# Patient Record
Sex: Female | Born: 1970 | Race: Black or African American | Hispanic: No | Marital: Single | Smoking: Never smoker
Health system: Southern US, Community
[De-identification: ages and names within clinical notes are randomized; demographics above are authoritative.]

## PROBLEM LIST (undated history)

## (undated) DIAGNOSIS — K219 Gastro-esophageal reflux disease without esophagitis: Secondary | ICD-10-CM

## (undated) DIAGNOSIS — I739 Peripheral vascular disease, unspecified: Secondary | ICD-10-CM

## (undated) DIAGNOSIS — I499 Cardiac arrhythmia, unspecified: Secondary | ICD-10-CM

## (undated) DIAGNOSIS — M869 Osteomyelitis, unspecified: Secondary | ICD-10-CM

## (undated) DIAGNOSIS — I82409 Acute embolism and thrombosis of unspecified deep veins of unspecified lower extremity: Secondary | ICD-10-CM

## (undated) DIAGNOSIS — I2510 Atherosclerotic heart disease of native coronary artery without angina pectoris: Secondary | ICD-10-CM

## (undated) DIAGNOSIS — Z992 Dependence on renal dialysis: Secondary | ICD-10-CM

## (undated) DIAGNOSIS — E119 Type 2 diabetes mellitus without complications: Secondary | ICD-10-CM

## (undated) DIAGNOSIS — I509 Heart failure, unspecified: Secondary | ICD-10-CM

## (undated) DIAGNOSIS — I10 Essential (primary) hypertension: Secondary | ICD-10-CM

## (undated) DIAGNOSIS — N189 Chronic kidney disease, unspecified: Secondary | ICD-10-CM

## (undated) DIAGNOSIS — E785 Hyperlipidemia, unspecified: Secondary | ICD-10-CM

## (undated) DIAGNOSIS — J45909 Unspecified asthma, uncomplicated: Secondary | ICD-10-CM

## (undated) DIAGNOSIS — Z972 Presence of dental prosthetic device (complete) (partial): Secondary | ICD-10-CM

## (undated) DIAGNOSIS — M109 Gout, unspecified: Secondary | ICD-10-CM

## (undated) HISTORY — PX: OTHER SURGICAL HISTORY: SHX169

## (undated) HISTORY — PX: ANKLE SURGERY: SHX546

## (undated) HISTORY — PX: TOE AMPUTATION: SHX809

---

## 2007-03-18 HISTORY — PX: ENDOMETRIAL ABLATION: SHX621

## 2008-11-28 ENCOUNTER — Ambulatory Visit: Payer: Self-pay | Admitting: Ophthalmology

## 2008-11-29 ENCOUNTER — Ambulatory Visit: Payer: Self-pay | Admitting: Ophthalmology

## 2011-03-18 HISTORY — PX: OTHER SURGICAL HISTORY: SHX169

## 2013-09-13 HISTORY — PX: TRANSMETATARSAL AMPUTATION: SHX6197

## 2014-01-03 ENCOUNTER — Ambulatory Visit: Payer: Self-pay | Admitting: Ophthalmology

## 2014-01-19 ENCOUNTER — Ambulatory Visit: Payer: Self-pay | Admitting: Ophthalmology

## 2014-07-08 NOTE — Op Note (Signed)
PATIENT NAME:  Sarah MuskratRICHMOND, Sarah MR#:  [id number] DATE OF BIRTH:  1970-12-17  DATE OF PROCEDURE:  01/19/2014  PREOPERATIVE DIAGNOSIS: Nuclear sclerotic cataract in the left eye.  POSTOPERATIVE DIAGNOSIS: Nuclear sclerotic cataract in the left eye. (H25.12)  PROCEDURE PERFORMED: Phacoemulsification with posterior chamber intraocular lens in the left eye.   SURGEON: Lia HoppingNisha Talah Cookston, MD  INDICATION: This is a 44 year old female with decreased vision in the left eye. The procedure, risks and benefits of cataract surgery were discussed at length with the patient including bleeding, infection, retinal detachment, re-operation, diplopia, ptosis, loss of vision and loss of the eye. Informed consent was obtained on the day of surgery. Several sets of preoperative medication were administered to the operative eye including 0.5% tetracaine, 1% cyclopentolate, 10% phenylephrine, 0.5% ketorolac, 0.5% gitaloxin, and 2% lidocaine.  DESCRIPTION OF PROCEDURE: The patient was taken to the operating room and sedated by IV sedation. Topical tetracaine was placed on the eye.  The operative eye was prepped using a 10% betadine solution and covered in sterile drapes leaving only the operative eye exposed. A Lieberman lid speculum was placed to provide exposure. Using 0.12 forceps and a side-port blade, a paracentesis was created. Then a mixture of BSS preservative-free lidocaine and epinephrine was injected into the anterior chamber. Next, Vision Blue was injected into the anterior chamber and washed out with balanced salt solution. Next, a 2.4 mm keratome blade was used to create a two-step full-thickness clear corneal incision temporally. At this point, the pupil was found to be adhered to the anterior lens capsule and a cyclodialysis spatula was used to clear adhesions between the iris and the anterior lens capsule. A Malyugin ring was then inserted into the anterior chamber, again to hold the iris back. A cystitome and  Utrata forceps were then used to create a continuous capsulorrhexis in the anterior lens capsule. BSS on a hydrodissection cannula was used to perform gentle hydrodissection. Phacoemulsification was then performed to remove the nucleus. Irrigation/aspiration was performed to remove the remaining cortical material. At this point, a rent in the posterior capsule was noted. Viscoat was used to fill the anterior chamber and hold the vitreous. Triesence was instilled into the anterior chamber to visualize any vitreous; however, no vitreous was visualized. The pupil was found to be round without any peaking. A MA60AC 17.5-diopter lens was inserted into the ciliary sulcus. The Sinskey hook was to rotate it into proper position in the ciliary sulcus. Aeration/aspiration was performed to remove the remaining Viscoat elastic material from the eye. BSS on a 30-gauge canula was used to hydrate the wound. An intracameral antibiotic was administered. The wounds were checked and found to be watertight. The lid speculum and drapes were carefully removed. Several drops of Vigamox were placed in the operative eye and the eye was covered with protective eyewear. The patient was taken to the recovery area in good condition. In the postanesthesia recovery unit, the patient was given 500 mg of Diamox by mouth.    ____________________________ Lia HoppingNisha Achilles Neville, MD nm:sb D: 01/19/2014 11:42:27 ET T: 01/19/2014 13:30:41 ET JOB#: [id number]  cc: Lia HoppingNisha Jubilee Vivero, MD, <Dictator> Lia HoppingNISHA Latavion Halls MD ELECTRONICALLY SIGNED 01/26/2014 13:45

## 2014-09-14 ENCOUNTER — Encounter
Admission: RE | Admit: 2014-09-14 | Discharge: 2014-09-14 | Disposition: A | Discharge status: Home / Self Care | Payer: Medicare PPO | Source: Ambulatory Visit | Attending: Vascular Surgery | Admitting: Vascular Surgery

## 2014-09-14 DIAGNOSIS — Z01812 Encounter for preprocedural laboratory examination: Secondary | ICD-10-CM | POA: Diagnosis present

## 2014-09-14 DIAGNOSIS — Z01810 Encounter for preprocedural cardiovascular examination: Secondary | ICD-10-CM | POA: Insufficient documentation

## 2014-09-14 HISTORY — DX: Gout, unspecified: M10.9

## 2014-09-14 HISTORY — DX: Unspecified asthma, uncomplicated: J45.909

## 2014-09-14 HISTORY — DX: Essential (primary) hypertension: I10

## 2014-09-14 HISTORY — DX: Heart failure, unspecified: I50.9

## 2014-09-14 HISTORY — DX: Cardiac arrhythmia, unspecified: I49.9

## 2014-09-14 HISTORY — DX: Atherosclerotic heart disease of native coronary artery without angina pectoris: I25.10

## 2014-09-14 HISTORY — DX: Chronic kidney disease, unspecified: N18.9

## 2014-09-14 HISTORY — DX: Peripheral vascular disease, unspecified: I73.9

## 2014-09-14 HISTORY — DX: Hyperlipidemia, unspecified: E78.5

## 2014-09-14 HISTORY — DX: Osteomyelitis, unspecified: M86.9

## 2014-09-14 HISTORY — DX: Acute embolism and thrombosis of unspecified deep veins of unspecified lower extremity: I82.409

## 2014-09-14 LAB — PROTIME-INR
INR: 1.02
PROTHROMBIN TIME: 13.6 s (ref 11.4–15.0)

## 2014-09-14 LAB — CBC
HEMATOCRIT: 29.1 % — AB (ref 35.0–47.0)
Hemoglobin: 9 g/dL — ABNORMAL LOW (ref 12.0–16.0)
MCH: 28.9 pg (ref 26.0–34.0)
MCHC: 30.9 g/dL — ABNORMAL LOW (ref 32.0–36.0)
MCV: 93.7 fL (ref 80.0–100.0)
PLATELETS: 471 10*3/uL — AB (ref 150–440)
RBC: 3.11 MIL/uL — ABNORMAL LOW (ref 3.80–5.20)
RDW: 18.9 % — ABNORMAL HIGH (ref 11.5–14.5)
WBC: 11 10*3/uL (ref 3.6–11.0)

## 2014-09-14 LAB — BASIC METABOLIC PANEL
Anion gap: 18 — ABNORMAL HIGH (ref 5–15)
BUN: 58 mg/dL — AB (ref 6–20)
CALCIUM: 8.9 mg/dL (ref 8.9–10.3)
CHLORIDE: 97 mmol/L — AB (ref 101–111)
CO2: 26 mmol/L (ref 22–32)
Creatinine, Ser: 8.02 mg/dL — ABNORMAL HIGH (ref 0.44–1.00)
GFR calc Af Amer: 6 mL/min — ABNORMAL LOW (ref >60)
GFR calc non Af Amer: 5 mL/min — ABNORMAL LOW (ref >60)
Glucose, Bld: 110 mg/dL — ABNORMAL HIGH (ref 65–99)
Potassium: 4.9 mmol/L (ref 3.5–5.1)
SODIUM: 141 mmol/L (ref 135–145)

## 2014-09-14 LAB — TYPE AND SCREEN
ABO/RH(D): B POS
Antibody Screen: NEGATIVE

## 2014-09-14 LAB — APTT: APTT: 37 s — AB (ref 24–36)

## 2014-09-14 NOTE — Patient Instructions (Signed)
  Your procedure is scheduled on: 09/27/14 Wed Report to Day Surgery. To find out your arrival time please call [phone] between 1PM - 3PM on 09/26/14 Tues.  Remember: Instructions that are not followed completely may result in serious medical risk, up to and including death, or upon the discretion of your surgeon and anesthesiologist your surgery may need to be rescheduled.    __x__ 1. Do not eat food or drink liquids after midnight. No gum chewing or hard candies.     ____ 2. No Alcohol for 24 hours before or after surgery.   ____ 3. Bring all medications with you on the day of surgery if instructed.    __x__ 4. Notify your doctor if there is any change in your medical condition     (cold, fever, infections).     Do not wear jewelry, make-up, hairpins, clips or nail polish.  Do not wear lotions, powders, or perfumes. You may wear deodorant.  Do not shave 48 hours prior to surgery. Men may shave face and neck.  Do not bring valuables to the hospital.    [facility] not responsible for any belongings or valuables.               Contacts, dentures or bridgework may not be worn into surgery.  Leave your suitcase in the car. After surgery it may be brought to your room.  For patients admitted to the hospital, discharge time is determined by your                treatment team.   Patients discharged the day of surgery will not be allowed to drive home.   Please read over the following fact sheets that you were given:      _x___ Take these medicines the morning of surgery with A SIP OF WATER:    1. albuterol (PROVENTIL HFA;VENTOLIN HFA) 108 (90 BASE) MCG/ACT inhaler  2. gabapentin (NEURONTIN) 100 MG capsule  3.   4.  5.  6.  ____ Fleet Enema (as directed)   _x___ Use CHG Soap as directed  __x__ Use inhalers on the day of surgery Albuterol  ____ Stop metformin 2 days prior to surgery    _x___ Take 1/2 of usual insulin dose the night before surgery and none on the  morning of surgery.   _x___ Stop Coumadin/Plavix/aspirin on  Stop plavix 7 days before surgery  ____ Stop Anti-inflammatories on    ____ Stop supplements until after surgery.    ____ Bring C-Pap to the hospital.

## 2014-09-15 LAB — ABO/RH: ABO/RH(D): B POS

## 2014-09-26 MED ORDER — CEFAZOLIN SODIUM 1-5 GM-% IV SOLN
1.0000 g | Freq: Once | INTRAVENOUS | Status: AC
  Administered 2014-09-27: 1 g via INTRAVENOUS

## 2014-09-27 ENCOUNTER — Ambulatory Visit: Payer: Medicare PPO | Admitting: Certified Registered Nurse Anesthetist

## 2014-09-27 ENCOUNTER — Encounter: Payer: Self-pay | Admitting: Anesthesiology

## 2014-09-27 ENCOUNTER — Ambulatory Visit
Admission: RE | Admit: 2014-09-27 | Discharge: 2014-09-27 | Disposition: A | Discharge status: Home / Self Care | Payer: Medicare PPO | Source: Ambulatory Visit | Attending: Vascular Surgery | Admitting: Vascular Surgery

## 2014-09-27 ENCOUNTER — Encounter
Admission: RE | Disposition: A | Discharge status: Home / Self Care | Payer: Self-pay | Source: Ambulatory Visit | Attending: Vascular Surgery

## 2014-09-27 DIAGNOSIS — Z79899 Other long term (current) drug therapy: Secondary | ICD-10-CM | POA: Insufficient documentation

## 2014-09-27 DIAGNOSIS — Z992 Dependence on renal dialysis: Secondary | ICD-10-CM | POA: Insufficient documentation

## 2014-09-27 DIAGNOSIS — E1122 Type 2 diabetes mellitus with diabetic chronic kidney disease: Secondary | ICD-10-CM | POA: Diagnosis not present

## 2014-09-27 DIAGNOSIS — Z7951 Long term (current) use of inhaled steroids: Secondary | ICD-10-CM | POA: Diagnosis not present

## 2014-09-27 DIAGNOSIS — I509 Heart failure, unspecified: Secondary | ICD-10-CM | POA: Insufficient documentation

## 2014-09-27 DIAGNOSIS — I2510 Atherosclerotic heart disease of native coronary artery without angina pectoris: Secondary | ICD-10-CM | POA: Diagnosis not present

## 2014-09-27 DIAGNOSIS — J45909 Unspecified asthma, uncomplicated: Secondary | ICD-10-CM | POA: Insufficient documentation

## 2014-09-27 DIAGNOSIS — N186 End stage renal disease: Secondary | ICD-10-CM | POA: Diagnosis present

## 2014-09-27 DIAGNOSIS — Z7982 Long term (current) use of aspirin: Secondary | ICD-10-CM | POA: Insufficient documentation

## 2014-09-27 DIAGNOSIS — Z87898 Personal history of other specified conditions: Secondary | ICD-10-CM | POA: Insufficient documentation

## 2014-09-27 DIAGNOSIS — I739 Peripheral vascular disease, unspecified: Secondary | ICD-10-CM | POA: Diagnosis not present

## 2014-09-27 DIAGNOSIS — I499 Cardiac arrhythmia, unspecified: Secondary | ICD-10-CM | POA: Insufficient documentation

## 2014-09-27 DIAGNOSIS — Z794 Long term (current) use of insulin: Secondary | ICD-10-CM | POA: Insufficient documentation

## 2014-09-27 DIAGNOSIS — Z89429 Acquired absence of other toe(s), unspecified side: Secondary | ICD-10-CM | POA: Insufficient documentation

## 2014-09-27 DIAGNOSIS — I132 Hypertensive heart and chronic kidney disease with heart failure and with stage 5 chronic kidney disease, or end stage renal disease: Secondary | ICD-10-CM | POA: Diagnosis not present

## 2014-09-27 HISTORY — PX: AV FISTULA PLACEMENT: SHX1204

## 2014-09-27 LAB — GLUCOSE, CAPILLARY: Glucose-Capillary: 154 mg/dL — ABNORMAL HIGH (ref 65–99)

## 2014-09-27 LAB — POCT I-STAT 4, (NA,K, GLUC, HGB,HCT): POTASSIUM: 5.2 mmol/L

## 2014-09-27 LAB — HCG, QUANTITATIVE, PREGNANCY: hCG, Beta Chain, Quant, S: 4 m[IU]/mL (ref <5)

## 2014-09-27 SURGERY — ARTERIOVENOUS (AV) FISTULA CREATION
Anesthesia: General | Laterality: Right

## 2014-09-27 MED ORDER — ONDANSETRON HCL 4 MG/2ML IJ SOLN: 4.0000 mg | Freq: Once | INTRAMUSCULAR | Status: DC | PRN

## 2014-09-27 MED ORDER — HEPARIN SODIUM (PORCINE) 5000 UNIT/ML IJ SOLN
INTRAMUSCULAR | Status: AC
  Filled 2014-09-27: qty 1

## 2014-09-27 MED ORDER — HYDROCODONE-ACETAMINOPHEN 5-325 MG PO TABS: 1.0000 | ORAL_TABLET | Freq: Four times a day (QID) | ORAL | Status: AC | PRN

## 2014-09-27 MED ORDER — FENTANYL CITRATE (PF) 100 MCG/2ML IJ SOLN
INTRAMUSCULAR | Status: AC
  Filled 2014-09-27: qty 2

## 2014-09-27 MED ORDER — MIDAZOLAM HCL 2 MG/2ML IJ SOLN
INTRAMUSCULAR | Status: DC | PRN
  Administered 2014-09-27: 2 mg via INTRAVENOUS

## 2014-09-27 MED ORDER — DEXAMETHASONE SODIUM PHOSPHATE 4 MG/ML IJ SOLN
INTRAMUSCULAR | Status: DC | PRN
  Administered 2014-09-27: 10 mg via INTRAVENOUS

## 2014-09-27 MED ORDER — FAMOTIDINE 20 MG PO TABS
ORAL_TABLET | ORAL | Status: AC
  Administered 2014-09-27: 20 mg via ORAL
  Filled 2014-09-27: qty 1

## 2014-09-27 MED ORDER — FAMOTIDINE 20 MG PO TABS
20.0000 mg | ORAL_TABLET | Freq: Once | ORAL | Status: AC
  Administered 2014-09-27: 20 mg via ORAL

## 2014-09-27 MED ORDER — LIDOCAINE HCL (CARDIAC) 20 MG/ML IV SOLN
INTRAVENOUS | Status: DC | PRN
  Administered 2014-09-27: 100 mg via INTRAVENOUS

## 2014-09-27 MED ORDER — BUPIVACAINE-EPINEPHRINE (PF) 0.5% -1:200000 IJ SOLN
INTRAMUSCULAR | Status: AC
  Filled 2014-09-27: qty 30

## 2014-09-27 MED ORDER — ONDANSETRON HCL 4 MG/2ML IJ SOLN
INTRAMUSCULAR | Status: DC | PRN
  Administered 2014-09-27: 4 mg via INTRAVENOUS

## 2014-09-27 MED ORDER — PAPAVERINE HCL 30 MG/ML IJ SOLN
INTRAMUSCULAR | Status: AC
  Filled 2014-09-27: qty 2

## 2014-09-27 MED ORDER — SODIUM CHLORIDE 0.9 % IV SOLN
INTRAVENOUS | Status: DC
  Administered 2014-09-27 (×3): via INTRAVENOUS

## 2014-09-27 MED ORDER — PROPOFOL 10 MG/ML IV BOLUS
INTRAVENOUS | Status: DC | PRN
  Administered 2014-09-27: 150 mg via INTRAVENOUS

## 2014-09-27 MED ORDER — FENTANYL CITRATE (PF) 100 MCG/2ML IJ SOLN
INTRAMUSCULAR | Status: DC | PRN
  Administered 2014-09-27: 100 ug via INTRAVENOUS

## 2014-09-27 MED ORDER — HEPARIN SODIUM (PORCINE) 1000 UNIT/ML IJ SOLN
INTRAMUSCULAR | Status: DC | PRN
  Administered 2014-09-27: 3 mL via INTRAVENOUS

## 2014-09-27 MED ORDER — FENTANYL CITRATE (PF) 100 MCG/2ML IJ SOLN
25.0000 ug | INTRAMUSCULAR | Status: DC | PRN
  Administered 2014-09-27 (×2): 25 ug via INTRAVENOUS

## 2014-09-27 MED ORDER — CEFAZOLIN SODIUM 1-5 GM-% IV SOLN
INTRAVENOUS | Status: AC
  Administered 2014-09-27: 1 g via INTRAVENOUS
  Filled 2014-09-27: qty 50

## 2014-09-27 SURGICAL SUPPLY — 51 items
BAG DECANTER STRL (MISCELLANEOUS) ×3 IMPLANT
BLADE SURG SZ11 CARB STEEL (BLADE) ×3 IMPLANT
BOOT SUTURE AID YELLOW STND (SUTURE) ×3 IMPLANT
BRUSH SCRUB 4% CHG (MISCELLANEOUS) ×3 IMPLANT
CANISTER SUCT 1200ML W/VALVE (MISCELLANEOUS) ×3 IMPLANT
CHLORAPREP W/TINT 26ML (MISCELLANEOUS) ×3 IMPLANT
CLIP SPRNG 6MM S-JAW DBL (CLIP) ×3
ELECT CAUTERY BLADE 6.4 (BLADE) ×3 IMPLANT
EVICEL 2ML SEALANT HUMAN (Miscellaneous) ×3 IMPLANT
GEL ULTRASOUND 20GR AQUASONIC (MISCELLANEOUS) ×3 IMPLANT
GLOVE BIO SURGEON STRL SZ7 (GLOVE) ×3 IMPLANT
GOWN STRL REUS W/ TWL LRG LVL3 (GOWN DISPOSABLE) ×1 IMPLANT
GOWN STRL REUS W/ TWL XL LVL3 (GOWN DISPOSABLE) ×1 IMPLANT
GOWN STRL REUS W/TWL LRG LVL3 (GOWN DISPOSABLE) ×2
GOWN STRL REUS W/TWL XL LVL3 (GOWN DISPOSABLE) ×2
GRAFT PROPATEN STD WALL 6X40 (Vascular Products) ×3 IMPLANT
HEMOSTAT SURGICEL 2X3 (HEMOSTASIS) ×3 IMPLANT
IV NS 500ML (IV SOLUTION) ×2
IV NS 500ML BAXH (IV SOLUTION) ×1 IMPLANT
KIT RM TURNOVER STRD PROC AR (KITS) ×3 IMPLANT
LABEL OR SOLS (LABEL) ×3 IMPLANT
LIQUID BAND (GAUZE/BANDAGES/DRESSINGS) ×3 IMPLANT
LOOP RED MAXI  1X406MM (MISCELLANEOUS) ×2
LOOP VESSEL MAXI 1X406 RED (MISCELLANEOUS) ×1 IMPLANT
LOOP VESSEL MINI 0.8X406 BLUE (MISCELLANEOUS) ×1 IMPLANT
LOOPS BLUE MINI 0.8X406MM (MISCELLANEOUS) ×2
NEEDLE FILTER BLUNT 18X 1/2SAF (NEEDLE) ×2
NEEDLE FILTER BLUNT 18X1 1/2 (NEEDLE) ×1 IMPLANT
NEEDLE HYPO 30X.5 LL (NEEDLE) ×3 IMPLANT
NS IRRIG 500ML POUR BTL (IV SOLUTION) ×3 IMPLANT
PACK EXTREMITY ARMC (MISCELLANEOUS) ×3 IMPLANT
PAD GROUND ADULT SPLIT (MISCELLANEOUS) ×3 IMPLANT
PAD PREP 24X41 OB/GYN DISP (PERSONAL CARE ITEMS) ×3 IMPLANT
SOLUTION CELL SAVER (CLIP) ×1 IMPLANT
STOCKINETTE STRL 4IN 9604848 (GAUZE/BANDAGES/DRESSINGS) ×3 IMPLANT
SUT GTX CV-6 30 (SUTURE) ×6 IMPLANT
SUT MNCRL AB 4-0 PS2 18 (SUTURE) ×3 IMPLANT
SUT PROLENE 6 0 BV (SUTURE) ×12 IMPLANT
SUT SILK 2 0 (SUTURE) ×2
SUT SILK 2 0 SH (SUTURE) ×3 IMPLANT
SUT SILK 2-0 18XBRD TIE 12 (SUTURE) ×1 IMPLANT
SUT SILK 3 0 (SUTURE) ×2
SUT SILK 3-0 18XBRD TIE 12 (SUTURE) ×1 IMPLANT
SUT SILK 4 0 (SUTURE) ×2
SUT SILK 4-0 18XBRD TIE 12 (SUTURE) ×1 IMPLANT
SUT VIC AB 3-0 SH 27 (SUTURE) ×4
SUT VIC AB 3-0 SH 27X BRD (SUTURE) ×2 IMPLANT
SYR 20CC LL (SYRINGE) ×3 IMPLANT
SYR 3ML LL SCALE MARK (SYRINGE) ×3 IMPLANT
SYR TB 1ML 27GX1/2 LL (SYRINGE) ×3 IMPLANT
TOWEL OR 17X26 4PK STRL BLUE (TOWEL DISPOSABLE) ×3 IMPLANT

## 2014-09-27 NOTE — Transfer of Care (Signed)
Immediate Anesthesia Transfer of Care Note  Patient: Sarah MuskratLaura Corkery  Procedure(s) Performed: Procedure(s): ARTERIOVENOUS (AV) FISTULA CREATION (Right)  Patient Location: PACU  Anesthesia Type:General  Level of Consciousness: sedated  Airway & Oxygen Therapy: Patient connected to face mask oxygen  Post-op Assessment: Report given to RN and Post -op Vital signs reviewed and stable  Post vital signs: Reviewed and stable  Last Vitals:  Filed Vitals:   09/27/14 1141  BP: 128/89  Pulse: 84  Temp: 36.6 C  Resp: 16    Complications: No apparent anesthesia complications

## 2014-09-27 NOTE — Anesthesia Procedure Notes (Signed)
Procedure Name: LMA Insertion Date/Time: 09/27/2014 2:30 PM Performed by: Junious SilkNOLES, Kinan Safley Pre-anesthesia Checklist: Patient identified, Emergency Drugs available and Suction available Patient Re-evaluated:Patient Re-evaluated prior to inductionOxygen Delivery Method: Circle system utilized Preoxygenation: Pre-oxygenation with 100% oxygen Intubation Type: IV induction Ventilation: Mask ventilation without difficulty LMA: LMA inserted LMA Size: 3.5 Tube type: Oral Number of attempts: 1 Placement Confirmation: positive ETCO2 and breath sounds checked- equal and bilateral Tube secured with: Tape Dental Injury: Teeth and Oropharynx as per pre-operative assessment

## 2014-09-27 NOTE — H&P (Signed)
[facility] History & Physical Update  The patient was interviewed and re-examined.  The patient's previous History and Physical has been reviewed and is unchanged.  There is no change in the plan of care. We plan to proceed with the scheduled procedure.  DEW,JASON, MD  09/27/2014, 2:09 PM

## 2014-09-27 NOTE — Anesthesia Preprocedure Evaluation (Addendum)
Anesthesia Evaluation  Patient identified by MRN, date of birth, ID band Patient awake    Reviewed: Allergy & Precautions, NPO status , Patient's Chart, lab work & pertinent test results, reviewed documented beta blocker date and time   Airway Mallampati: II  TM Distance: >3 FB     Dental  (+) Chipped   Pulmonary asthma ,          Cardiovascular hypertension, + CAD, + Peripheral Vascular Disease and +CHF + dysrhythmias     Neuro/Psych    GI/Hepatic   Endo/Other    Renal/GU CRF and ESRFRenal disease     Musculoskeletal   Abdominal   Peds  Hematology   Anesthesia Other Findings K=5.2 this am.  Reproductive/Obstetrics                            Anesthesia Physical Anesthesia Plan  ASA: III  Anesthesia Plan: General   Post-op Pain Management:    Induction: Intravenous  Airway Management Planned: LMA  Additional Equipment:   Intra-op Plan:   Post-operative Plan:   Informed Consent: I have reviewed the patients History and Physical, chart, labs and discussed the procedure including the risks, benefits and alternatives for the proposed anesthesia with the patient or authorized representative who has indicated his/her understanding and acceptance.     Plan Discussed with: CRNA  Anesthesia Plan Comments:         Anesthesia Quick Evaluation

## 2014-09-27 NOTE — Op Note (Signed)
[facility]  OPERATIVE NOTE   PROCEDURE:  right upper arm brachial artery to axillary vein arteriovenous graft  PRE-OPERATIVE DIAGNOSIS: 1. end stage renal disease    POST-OPERATIVE DIAGNOSIS: same  SURGEON: Kyani Simkin  ASSISTANT(S): none  ANESTHESIA: general  ESTIMATED BLOOD LOSS: 25 cc  FINDING(S): 1. Small vessels, brachial vein marginal for graft creation so brach-ax graft created  SPECIMEN(S):  None  INDICATIONS:   Sarah Schroeder is a 44 y.o. female who presents with end stage renal disease and need for permanent dialysis access.  She had been previous mapped for AVF and had small superficial veins were seen.  Risk, benefits, and alternatives to access surgery were discussed.  The patient is aware the risks include but are not limited to: bleeding, infection, steal syndrome, nerve damage, ischemic monomelic neuropathy, failure to mature, and need for additional procedures.  The patient is aware of the risks and elects to proceed forward.  DESCRIPTION: After full informed written consent was obtained from the patient, the patient was brought back to the operating room and placed supine upon the operating table.  The patient was given IV antibiotics prior to proceeding.  After obtaining adequate sedation, the patient was prepped and draped in standard fashion for a right arm access procedure.  I turned my attention first to the antecubitum.   I made an incision over the brachial artery, and dissected down through the subcutaneous tissue to the fascia carefully and was able to dissect out the brachial artery.  The artery small but patent and appeared as if it would support a graft.  It was controlled proximally and distally with vessel loops and then I turned my attention to the high bicipital groove in the axilla as the brachial vein in the antecubital fossa was small and I was concerned it would not support a graft.  I made an incision and dissected down  through the subcutaneous tissue and fascia until I reached the axillary vein.  It was patent and adequate size for graft creation.  I then dissected this vein proximally and distal and prepared it for control with Bulldog clamps.  I took a Company secretary and dissected from the antecubital up to the axillary incision.  Then I delivered the 6mm Propaten PTFE graft through this metal tunneler and then pulled out the metal tunneler leaving the graft in place and making sure the line was up for orientation.  I then gave the patient 3000 units of heparin to gain some anticoagulation.  After waiting 3 minutes, I placed the brachial artery under tension proximally and distally with vessel loops, made an arteriotomy and extended it with a Potts scissor.  I sewed the graft to this arteriotomy with a running stitch of CV-6 suture.  At this point, then I completed the anastomosis in the usual fashion.  I released the vessel loops on the inflow and allowed the artery to decompress through the graft. There was good pulsatile bleeding through this graft.  I clamped the graft near its arterial anastomosis and sucked out all the blood in the graft and loaded the graft with heparinized saline.  At this point, I pulled the graft to appropriate length and reset my exposure of the axillary vein.  Then, I controlled the vein with Bulldog clamps.  An anterior wall venotomy was created with an 11 blade and extended with Potts scissors.  I then spatulated the graft to facilitate an end-to- side anastomosis matching the arteriotomy.  In the  process of spatulating, I cut the graft to appropriate length for this anastomosis.  This graft was sewn to the vein in an end-to-side configuration with a running CV-6 suture.  Prior to completing this anastomosis, I allowed the vein to back bleed and then I also allowed the artery to bleed in an antegrade fashion.  I completed this anastomosis in the usual fashion, and then irrigated out the high  bicipital exposure and then placed Surgicel and Evicel.  I then turned my attention back to the antecubitum.  There was pulsatile flow in the artery beyond the graft.   At this point, I washed out the antecubital incision. Surgicel and Evicel were then placed. There was no more active bleeding.  The subcutaneous tissue was reapproximated with a running stitch of 3-0 Vicryl.  The skin was then reapproximated with a running subcuticular 4-0 Monocryl.  The skin was then cleaned, dried, and Dermabond used to reinforce the skin closure.  We then turned our attention to the axillary incision.  The subcutaneous tissue was repaired with running stitch of 3-0 Vicryl.  The skin was then reapproximated with running subcuticular 4-0 Monocryl.  The skin was then cleaned, dried, and then the skin closure was reinforced with Dermabond.  The patient was then awakened from anesthesia and taken to the recovery room in stable condition having tolerated the procedure well.    COMPLICATIONS: None  CONDITION: Stable   Sarah Schroeder 09/27/2014 4:21 PM

## 2014-09-27 NOTE — Discharge Instructions (Signed)
Call or contact our office with fever >101, wound redness or drainage, severe pain, or other issues                                                                                                                                                                            AMBULATORY SURGERY  DISCHARGE INSTRUCTIONS   1) The drugs that you were given will stay in your system until tomorrow so for the next 24 hours you should not:  A) Drive an automobile B) Make any legal decisions C) Drink any alcoholic beverage   2) You may resume regular meals tomorrow.  Today it is better to start with liquids and gradually work up to solid foods.  You may eat anything you prefer, but it is better to start with liquids, then soup and crackers, and gradually work up to solid foods.   3) Please notify your doctor immediately if you have any unusual bleeding, trouble breathing, redness and pain at the surgery site, drainage, fever, or pain not relieved by medication.    4) Additional Instructions:        Please contact your physician with any problems or Same Day Surgery at [phone], Monday through Friday 6 am to 4 pm, or [place] at [facility] Main number at [phone].

## 2014-09-28 ENCOUNTER — Encounter: Payer: Self-pay | Admitting: Vascular Surgery

## 2014-09-29 NOTE — Anesthesia Postprocedure Evaluation (Signed)
  Anesthesia Post-op Note  Patient: Sarah MuskratLaura Schroeder  Procedure(s) Performed: Procedure(s): ARTERIOVENOUS (AV) FISTULA CREATION (Right)  Anesthesia type:General  Patient location: PACU  Post pain: Pain level controlled  Post assessment: Post-op Vital signs reviewed, Patient's Cardiovascular Status Stable, Respiratory Function Stable, Patent Airway and No signs of Nausea or vomiting  Post vital signs: Reviewed and stable  Last Vitals:  Filed Vitals:   09/27/14 1825  BP: 147/83  Pulse: 72  Temp: 35.7 C  Resp: 14    Level of consciousness: awake, alert  and patient cooperative  Complications: No apparent anesthesia complications

## 2015-07-17 HISTORY — PX: CARDIAC CATHETERIZATION: SHX172

## 2015-09-30 HISTORY — PX: PERITONEAL CATHETER REMOVAL: SUR1106

## 2016-06-23 HISTORY — PX: CARPAL TUNNEL RELEASE: SHX101

## 2016-06-23 HISTORY — PX: TRIGGER FINGER RELEASE: SHX641

## 2017-03-30 ENCOUNTER — Other Ambulatory Visit: Payer: Self-pay

## 2017-03-30 ENCOUNTER — Encounter: Payer: Self-pay | Admitting: *Deleted

## 2017-03-31 ENCOUNTER — Encounter: Payer: Self-pay | Admitting: Anesthesiology

## 2017-04-01 ENCOUNTER — Emergency Department: Payer: Medicare Other

## 2017-04-01 ENCOUNTER — Encounter: Payer: Self-pay | Admitting: Emergency Medicine

## 2017-04-01 ENCOUNTER — Inpatient Hospital Stay
Admission: EM | Admit: 2017-04-01 | Discharge: 2017-04-17 | DRG: 492 | Disposition: E | Payer: Medicare Other | Attending: Orthopedic Surgery | Admitting: Orthopedic Surgery

## 2017-04-01 ENCOUNTER — Other Ambulatory Visit: Payer: Self-pay

## 2017-04-01 DIAGNOSIS — M109 Gout, unspecified: Secondary | ICD-10-CM | POA: Diagnosis present

## 2017-04-01 DIAGNOSIS — Y92014 Private driveway to single-family (private) house as the place of occurrence of the external cause: Secondary | ICD-10-CM | POA: Diagnosis not present

## 2017-04-01 DIAGNOSIS — E1140 Type 2 diabetes mellitus with diabetic neuropathy, unspecified: Secondary | ICD-10-CM | POA: Diagnosis present

## 2017-04-01 DIAGNOSIS — W000XXA Fall on same level due to ice and snow, initial encounter: Secondary | ICD-10-CM | POA: Diagnosis present

## 2017-04-01 DIAGNOSIS — E1151 Type 2 diabetes mellitus with diabetic peripheral angiopathy without gangrene: Secondary | ICD-10-CM | POA: Diagnosis present

## 2017-04-01 DIAGNOSIS — Z992 Dependence on renal dialysis: Secondary | ICD-10-CM | POA: Diagnosis not present

## 2017-04-01 DIAGNOSIS — Z888 Allergy status to other drugs, medicaments and biological substances status: Secondary | ICD-10-CM

## 2017-04-01 DIAGNOSIS — I5032 Chronic diastolic (congestive) heart failure: Secondary | ICD-10-CM | POA: Diagnosis present

## 2017-04-01 DIAGNOSIS — N186 End stage renal disease: Secondary | ICD-10-CM | POA: Diagnosis present

## 2017-04-01 DIAGNOSIS — R570 Cardiogenic shock: Secondary | ICD-10-CM | POA: Diagnosis not present

## 2017-04-01 DIAGNOSIS — I462 Cardiac arrest due to underlying cardiac condition: Secondary | ICD-10-CM | POA: Diagnosis not present

## 2017-04-01 DIAGNOSIS — Z794 Long term (current) use of insulin: Secondary | ICD-10-CM | POA: Diagnosis not present

## 2017-04-01 DIAGNOSIS — Z7982 Long term (current) use of aspirin: Secondary | ICD-10-CM

## 2017-04-01 DIAGNOSIS — K219 Gastro-esophageal reflux disease without esophagitis: Secondary | ICD-10-CM | POA: Diagnosis present

## 2017-04-01 DIAGNOSIS — E1122 Type 2 diabetes mellitus with diabetic chronic kidney disease: Secondary | ICD-10-CM | POA: Diagnosis present

## 2017-04-01 DIAGNOSIS — E875 Hyperkalemia: Secondary | ICD-10-CM | POA: Diagnosis present

## 2017-04-01 DIAGNOSIS — Z4659 Encounter for fitting and adjustment of other gastrointestinal appliance and device: Secondary | ICD-10-CM

## 2017-04-01 DIAGNOSIS — M8660 Other chronic osteomyelitis, unspecified site: Secondary | ICD-10-CM | POA: Diagnosis present

## 2017-04-01 DIAGNOSIS — Z955 Presence of coronary angioplasty implant and graft: Secondary | ICD-10-CM | POA: Diagnosis not present

## 2017-04-01 DIAGNOSIS — R34 Anuria and oliguria: Secondary | ICD-10-CM | POA: Diagnosis present

## 2017-04-01 DIAGNOSIS — I469 Cardiac arrest, cause unspecified: Secondary | ICD-10-CM | POA: Diagnosis not present

## 2017-04-01 DIAGNOSIS — S82899A Other fracture of unspecified lower leg, initial encounter for closed fracture: Secondary | ICD-10-CM

## 2017-04-01 DIAGNOSIS — S82891A Other fracture of right lower leg, initial encounter for closed fracture: Secondary | ICD-10-CM | POA: Diagnosis present

## 2017-04-01 DIAGNOSIS — D631 Anemia in chronic kidney disease: Secondary | ICD-10-CM | POA: Diagnosis present

## 2017-04-01 DIAGNOSIS — I2510 Atherosclerotic heart disease of native coronary artery without angina pectoris: Secondary | ICD-10-CM | POA: Diagnosis present

## 2017-04-01 DIAGNOSIS — Z66 Do not resuscitate: Secondary | ICD-10-CM | POA: Diagnosis not present

## 2017-04-01 DIAGNOSIS — I213 ST elevation (STEMI) myocardial infarction of unspecified site: Secondary | ICD-10-CM | POA: Diagnosis not present

## 2017-04-01 DIAGNOSIS — E1169 Type 2 diabetes mellitus with other specified complication: Secondary | ICD-10-CM | POA: Diagnosis present

## 2017-04-01 DIAGNOSIS — Z978 Presence of other specified devices: Secondary | ICD-10-CM

## 2017-04-01 DIAGNOSIS — Z86718 Personal history of other venous thrombosis and embolism: Secondary | ICD-10-CM | POA: Diagnosis not present

## 2017-04-01 DIAGNOSIS — S82851A Displaced trimalleolar fracture of right lower leg, initial encounter for closed fracture: Principal | ICD-10-CM | POA: Diagnosis present

## 2017-04-01 DIAGNOSIS — I132 Hypertensive heart and chronic kidney disease with heart failure and with stage 5 chronic kidney disease, or end stage renal disease: Secondary | ICD-10-CM | POA: Diagnosis present

## 2017-04-01 DIAGNOSIS — I2119 ST elevation (STEMI) myocardial infarction involving other coronary artery of inferior wall: Secondary | ICD-10-CM | POA: Diagnosis not present

## 2017-04-01 DIAGNOSIS — R9431 Abnormal electrocardiogram [ECG] [EKG]: Secondary | ICD-10-CM

## 2017-04-01 LAB — TROPONIN I
TROPONIN I: 0.06 ng/mL — AB (ref <0.03)
Troponin I: 0.05 ng/mL (ref <0.03)
Troponin I: 0.06 ng/mL (ref <0.03)

## 2017-04-01 LAB — BASIC METABOLIC PANEL
ANION GAP: 17 — AB (ref 5–15)
BUN: 53 mg/dL — ABNORMAL HIGH (ref 6–20)
CALCIUM: 9.2 mg/dL (ref 8.9–10.3)
CO2: 26 mmol/L (ref 22–32)
CREATININE: 8.62 mg/dL — AB (ref 0.44–1.00)
Chloride: 97 mmol/L — ABNORMAL LOW (ref 101–111)
GFR calc non Af Amer: 5 mL/min — ABNORMAL LOW (ref >60)
GFR, EST AFRICAN AMERICAN: 6 mL/min — AB (ref >60)
Glucose, Bld: 198 mg/dL — ABNORMAL HIGH (ref 65–99)
Potassium: 5.3 mmol/L — ABNORMAL HIGH (ref 3.5–5.1)
SODIUM: 140 mmol/L (ref 135–145)

## 2017-04-01 LAB — CBC WITH DIFFERENTIAL/PLATELET
BASOS ABS: 0.1 10*3/uL (ref 0–0.1)
BASOS PCT: 1 %
Eosinophils Absolute: 0.3 10*3/uL (ref 0–0.7)
Eosinophils Relative: 4 %
HCT: 40.7 % (ref 35.0–47.0)
Hemoglobin: 12.9 g/dL (ref 12.0–16.0)
Lymphocytes Relative: 14 %
Lymphs Abs: 1 10*3/uL (ref 1.0–3.6)
MCH: 30.6 pg (ref 26.0–34.0)
MCHC: 31.7 g/dL — ABNORMAL LOW (ref 32.0–36.0)
MCV: 96.5 fL (ref 80.0–100.0)
Monocytes Absolute: 0.6 10*3/uL (ref 0.2–0.9)
Monocytes Relative: 8 %
NEUTROS ABS: 5.1 10*3/uL (ref 1.4–6.5)
Neutrophils Relative %: 73 %
Platelets: 205 10*3/uL (ref 150–440)
RBC: 4.22 MIL/uL (ref 3.80–5.20)
RDW: 16.1 % — ABNORMAL HIGH (ref 11.5–14.5)
WBC: 7 10*3/uL (ref 3.6–11.0)

## 2017-04-01 LAB — COMPREHENSIVE METABOLIC PANEL
ALBUMIN: 3.7 g/dL (ref 3.5–5.0)
ALT: 15 U/L (ref 14–54)
ANION GAP: 16 — AB (ref 5–15)
AST: 18 U/L (ref 15–41)
Alkaline Phosphatase: 148 U/L — ABNORMAL HIGH (ref 38–126)
BUN: 53 mg/dL — ABNORMAL HIGH (ref 6–20)
CHLORIDE: 96 mmol/L — AB (ref 101–111)
CO2: 26 mmol/L (ref 22–32)
Calcium: 9.4 mg/dL (ref 8.9–10.3)
Creatinine, Ser: 8.11 mg/dL — ABNORMAL HIGH (ref 0.44–1.00)
GFR calc non Af Amer: 5 mL/min — ABNORMAL LOW (ref >60)
GFR, EST AFRICAN AMERICAN: 6 mL/min — AB (ref >60)
GLUCOSE: 114 mg/dL — AB (ref 65–99)
Potassium: 5.9 mmol/L — ABNORMAL HIGH (ref 3.5–5.1)
SODIUM: 138 mmol/L (ref 135–145)
Total Bilirubin: 0.7 mg/dL (ref 0.3–1.2)
Total Protein: 7.9 g/dL (ref 6.5–8.1)

## 2017-04-01 LAB — PROTIME-INR
INR: 1.08
INR: 1.03
PROTHROMBIN TIME: 13.9 s (ref 11.4–15.2)
PROTHROMBIN TIME: 13.4 s (ref 11.4–15.2)

## 2017-04-01 LAB — APTT
aPTT: 34 seconds (ref 24–36)
aPTT: 33 seconds (ref 24–36)

## 2017-04-01 LAB — PHOSPHORUS: PHOSPHORUS: 8.4 mg/dL — AB (ref 2.5–4.6)

## 2017-04-01 LAB — GLUCOSE, CAPILLARY: Glucose-Capillary: 212 mg/dL — ABNORMAL HIGH (ref 65–99)

## 2017-04-01 LAB — SURGICAL PCR SCREEN
MRSA, PCR: NEGATIVE
STAPHYLOCOCCUS AUREUS: NEGATIVE

## 2017-04-01 MED ORDER — INSULIN ASPART 100 UNIT/ML ~~LOC~~ SOLN
0.0000 [IU] | Freq: Three times a day (TID) | SUBCUTANEOUS | Status: DC
  Administered 2017-04-02: 2 [IU] via SUBCUTANEOUS
  Filled 2017-04-01: qty 1

## 2017-04-01 MED ORDER — GABAPENTIN 100 MG PO CAPS
100.0000 mg | ORAL_CAPSULE | Freq: Two times a day (BID) | ORAL | Status: DC
  Administered 2017-04-01: 100 mg via ORAL
  Filled 2017-04-01: qty 1

## 2017-04-01 MED ORDER — ALBUTEROL SULFATE (2.5 MG/3ML) 0.083% IN NEBU: 2.5000 mg | INHALATION_SOLUTION | RESPIRATORY_TRACT | Status: DC | PRN

## 2017-04-01 MED ORDER — ALLOPURINOL 100 MG PO TABS
100.0000 mg | ORAL_TABLET | Freq: Every day | ORAL | Status: DC
  Filled 2017-04-01 (×2): qty 1

## 2017-04-01 MED ORDER — SODIUM BICARBONATE 8.4 % IV SOLN
25.0000 meq | Freq: Once | INTRAVENOUS | Status: AC
  Administered 2017-04-01: 25 meq via INTRAVENOUS
  Filled 2017-04-01: qty 50

## 2017-04-01 MED ORDER — FLUCONAZOLE 100 MG PO TABS: 100.0000 mg | ORAL_TABLET | ORAL | Status: DC

## 2017-04-01 MED ORDER — POTASSIUM CHLORIDE ER 10 MEQ PO TBCR
10.0000 meq | EXTENDED_RELEASE_TABLET | Freq: Every day | ORAL | Status: DC
  Filled 2017-04-01 (×2): qty 1

## 2017-04-01 MED ORDER — AMLODIPINE BESYLATE 10 MG PO TABS: 10.0000 mg | ORAL_TABLET | Freq: Every day | ORAL | Status: DC

## 2017-04-01 MED ORDER — LOSARTAN POTASSIUM 50 MG PO TABS: 50.0000 mg | ORAL_TABLET | Freq: Every day | ORAL | Status: DC

## 2017-04-01 MED ORDER — CALCIUM GLUCONATE 10 % IV SOLN
1.0000 g | Freq: Once | INTRAVENOUS | Status: AC
  Administered 2017-04-01: 1 g via INTRAVENOUS
  Filled 2017-04-01: qty 10

## 2017-04-01 MED ORDER — MAGNESIUM CITRATE PO SOLN: 1.0000 | Freq: Once | ORAL | Status: DC | PRN

## 2017-04-01 MED ORDER — OXYCODONE HCL 5 MG PO TABS
5.0000 mg | ORAL_TABLET | ORAL | Status: DC | PRN
  Administered 2017-04-01: 10 mg via ORAL
  Administered 2017-04-01: 5 mg via ORAL
  Administered 2017-04-02: 10 mg via ORAL
  Filled 2017-04-01 (×2): qty 2
  Filled 2017-04-01: qty 1

## 2017-04-01 MED ORDER — DOCUSATE SODIUM 100 MG PO CAPS
100.0000 mg | ORAL_CAPSULE | Freq: Two times a day (BID) | ORAL | Status: DC
  Administered 2017-04-01: 100 mg via ORAL
  Filled 2017-04-01: qty 1

## 2017-04-01 MED ORDER — BISACODYL 5 MG PO TBEC
5.0000 mg | DELAYED_RELEASE_TABLET | Freq: Every day | ORAL | Status: DC | PRN
  Filled 2017-04-01: qty 1

## 2017-04-01 MED ORDER — ALBUTEROL SULFATE HFA 108 (90 BASE) MCG/ACT IN AERS: 2.0000 | INHALATION_SPRAY | RESPIRATORY_TRACT | Status: DC | PRN

## 2017-04-01 MED ORDER — METOPROLOL TARTRATE 50 MG PO TABS
50.0000 mg | ORAL_TABLET | Freq: Two times a day (BID) | ORAL | Status: DC
Start: 2017-04-01 — End: 2017-04-02
  Administered 2017-04-01 – 2017-04-02 (×2): 50 mg via ORAL
  Filled 2017-04-01 (×2): qty 1

## 2017-04-01 MED ORDER — CALCIUM ACETATE (PHOS BINDER) 667 MG/5ML PO SOLN
667.0000 mg | Freq: Three times a day (TID) | ORAL | Status: DC
  Filled 2017-04-01 (×4): qty 5

## 2017-04-01 MED ORDER — MORPHINE SULFATE (PF) 2 MG/ML IV SOLN: 2.0000 mg | INTRAVENOUS | Status: DC | PRN

## 2017-04-01 MED ORDER — METOCLOPRAMIDE HCL 10 MG PO TABS: 10.0000 mg | ORAL_TABLET | Freq: Every day | ORAL | Status: DC

## 2017-04-01 MED ORDER — ZOLPIDEM TARTRATE 5 MG PO TABS
5.0000 mg | ORAL_TABLET | Freq: Every evening | ORAL | Status: DC | PRN
  Administered 2017-04-01: 5 mg via ORAL
  Filled 2017-04-01: qty 1

## 2017-04-01 MED ORDER — SODIUM CHLORIDE 0.9 % IV SOLN
INTRAVENOUS | Status: DC
  Administered 2017-04-02: 14:00:00 via INTRAVENOUS

## 2017-04-01 MED ORDER — ASPIRIN EC 81 MG PO TBEC: 81.0000 mg | DELAYED_RELEASE_TABLET | Freq: Every day | ORAL | Status: DC

## 2017-04-01 MED ORDER — HYDRALAZINE HCL 20 MG/ML IJ SOLN
10.0000 mg | Freq: Once | INTRAMUSCULAR | Status: AC
  Administered 2017-04-01: 10 mg via INTRAVENOUS

## 2017-04-01 MED ORDER — POLYETHYLENE GLYCOL 3350 17 G PO PACK: 17.0000 g | PACK | Freq: Every day | ORAL | Status: DC | PRN

## 2017-04-01 MED ORDER — HYDRALAZINE HCL 20 MG/ML IJ SOLN
INTRAMUSCULAR | Status: AC
  Administered 2017-04-01: 10 mg via INTRAVENOUS
  Filled 2017-04-01: qty 1

## 2017-04-01 NOTE — Consult Note (Signed)
Medical Consultation  Sarah Schroeder ZOX:[id number] DOB: 1970/06/07 DOA: 03/19/2017 PCP: Nonda Lou, MD   Requesting physician: Dr. Martha Clan Date of consultation: Jitter[place] 16 2019 Reason for consultation: Preop clearance  Impression/Recommendations  47 year old female with end-stage renal disease on hemodialysis, chronic diastolic heart failure and CAD who presents after mechanical fall.  1.Comminuted trimalleolar fracture with lateral displacement of the Talus and Distal fibular fracture is displaced 1 shaft width. Patient is low risk for moderate risk procedure may proceed to surgery without further cardiac intervention DVT prophylaxis as per orthopedic surgery. Troponin is slightly elevated and likely due to poor renal clearance I will order 2 more troponins prior to her planned surgery for a.m.   2. End-stage renal disease on hemodialysis: Patient did not have dialysis today. Nephrology consultation requested  3. Diabetes: ADA diet and sliding scale  4. Accelerated Essential hypertension due to pain: Continue losartan and metoprolol and Norvasc When necessary hydralazine  5. History of CAD: Continue aspirin, losartan, metoprolol 6. Elevated troponin: This is more than likely due to poor renal clearance. Follow troponins.  Chief Complaint: Ankle pain  HPI:  47 year old female with end-stage renal disease on hemodialysis and diabetes he was walking to her transport for dialysis this morning when she slipped on some black ice and fell. She was unable to get up and was brought to the ER for further evaluation. X-rays show patient has Comminuted trimalleolar fracture with lateral displacement of the talus and Distal fibular fracture is displaced.  Hospital services was contacted for preop evaluation. Patient denies chest pain, shortness of breath, syncope. She has been in her usual state of health.  Review of Systems  Constitutional: Negative for fever, chills  weight loss HENT: Negative for ear pain, nosebleeds, congestion, facial swelling, rhinorrhea, neck pain, neck stiffness and ear discharge.   Respiratory: Negative for cough, shortness of breath, wheezing  Cardiovascular: Negative for chest pain, palpitations and leg swelling.  Gastrointestinal: Negative for heartburn, abdominal pain, vomiting, diarrhea or consitpation Genitourinary: Negative for dysuria, urgency, frequency, hematuria Musculoskeletal: Negative for back pain or positive ankle pain  Neurological: Negative for dizziness, seizures, syncope, focal weakness,  numbness and headaches.  Hematological: Does not bruise/bleed easily.  Psychiatric/Behavioral: Negative for hallucinations, confusion, dysphoric mood   Past Medical History:  Diagnosis Date  . Arrhythmia   . Asthma   . CAD (coronary artery disease)   . CHF (congestive heart failure) (HCC)   . Chronic kidney disease   . Dialysis patient (HCC)    Mon, Wed, Fri  . DM (diabetes mellitus), type 2 (HCC)   . DVT (deep venous thrombosis) (HCC)    right arm  . Elevated lipids   . GERD (gastroesophageal reflux disease)   . Gout   . Hypertension   . Osteomyelitis (HCC)    chronic  . PVD (peripheral vascular disease) (HCC)   . Wears dentures    full upper and lower   Past Surgical History:  Procedure Laterality Date  . ANKLE SURGERY Bilateral    left(09/13/13), right(07/12/14) [facility]  . AV FISTULA PLACEMENT Right 09/27/2014   Procedure: ARTERIOVENOUS (AV) FISTULA CREATION;  Surgeon: Annice Needy, MD;  Location: [facility] ORS;  Service: Vascular;  Laterality: Right;  . CARDIAC CATHETERIZATION  07/17/2015   [facility] (x1)  . CARPAL TUNNEL RELEASE Right 06/23/2016   [facility]  . ENDOMETRIAL ABLATION  2009  . pd catheter insertion  2013  . PERITONEAL CATHETER REMOVAL  09/30/2015   [facility]  . stents in legs Bilateral   .  TOE AMPUTATION Bilateral   . 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 TRIGGER FINGER RELEASE  Right 06/23/2016   [facility]   Social History:  reports that  has never smoked. she has never used smokeless tobacco. She reports that she does not drink alcohol or use drugs.  Allergies  Allergen Reactions  . Lisinopril Cough   No family history on file.  Prior to Admission medications   Medication Sig Start Date End Date Taking? Authorizing Provider  allopurinol (ZYLOPRIM) 100 MG tablet Take 100 mg by mouth daily.   Yes [provider]  amLODipine (NORVASC) 10 MG tablet Take 10 mg by mouth daily.   Yes [provider]  aspirin 81 MG tablet Take 81 mg by mouth daily.   Yes [provider]  calcium acetate, Phos Binder, (PHOSLYRA) 667 MG/5ML SOLN Take 667 mg by mouth 3 (three) times daily with meals.   Yes [provider]  esomeprazole (NEXIUM) 20 MG capsule Take 20 mg by mouth daily at 12 noon.   Yes [provider]  fluconazole (DIFLUCAN) 100 MG tablet Take 100 mg by mouth 3 (three) times a week. Mon. Wed. Friday   Yes [provider]  gabapentin (NEURONTIN) 100 MG capsule Take 100 mg by mouth 2 (two) times daily. Alternate with lyrica every month    Yes [provider]  insulin glargine (LANTUS) 100 UNIT/ML injection Inject 3 Units into the skin at bedtime.   Yes [provider]  insulin lispro (HUMALOG) 100 UNIT/ML injection Inject 1-2 Units into the skin 3 (three) times daily before meals.    Yes [provider]  losartan (COZAAR) 50 MG tablet Take 50 mg by mouth daily.   Yes [provider]  metoCLOPramide (REGLAN) 10 MG tablet Take 10 mg by mouth daily.   Yes [provider]  metoprolol tartrate (LOPRESSOR) 50 MG tablet Take 50 mg by mouth 2 (two) times daily.   Yes [provider]  ondansetron (ZOFRAN) 4 MG tablet Take 4 mg by mouth every 8 (eight) hours as needed for nausea or vomiting.   Yes [provider]  potassium chloride (K-DUR) 10 MEQ tablet Take 10 mEq by mouth  daily.   Yes [provider]  albuterol (PROVENTIL HFA;VENTOLIN HFA) 108 (90 BASE) MCG/ACT inhaler Inhale 2 puffs into the lungs every 4 (four) hours as needed for wheezing or shortness of breath.    [provider]  gentamicin cream (GARAMYCIN) 0.1 % Apply 1 application topically daily. Mon, Wed, Fri    [provider]  HYDROcodone-acetaminophen (NORCO) 5-325 MG per tablet Take 1 tablet by mouth every 6 (six) hours as needed for moderate pain. Patient not taking: Reported on 04/23/2017 09/27/14   Annice Needyew, Jason S, MD  megestrol (MEGACE) 40 MG tablet Take 40 mg by mouth daily as needed.    [provider]  promethazine (PHENERGAN) 25 MG tablet Take 25 mg by mouth every 6 (six) hours as needed for nausea or vomiting.    [provider]  zolpidem (AMBIEN) 5 MG tablet Take 5 mg by mouth at bedtime as needed for sleep.    [provider]    Physical Exam: Blood pressure (!) 189/99, pulse 89, temperature 98.2 F (36.8 C), temperature source Oral, resp. rate (!) 26, height 5\' 4"  (1.626 m), weight 59 kg (130 lb), SpO2 100 %. @VITALS2 @ [employer]   07-01-17 1035  Weight: 59 kg (130 lb)   No  intake or output data in the 24 hours ending 04/09/2017 1222   Constitutional: Appears well-developed and well-nourished. No distress. HENT: Normocephalic. Marland Kitchen Oropharynx is clear and moist.  Eyes: Conjunctivae and EOM are normal. PERRLA, no scleral icterus.  Neck: Normal ROM. Neck supple. No JVD. No tracheal deviation. CVS: RRR, S1/S2 +, no murmurs, no gallops, no carotid bruit.  Pulmonary: Effort and breath sounds normal, no stridor, rhonchi, wheezes, rales.  Abdominal: Soft. BS +,  no distension, tenderness, rebound or guarding.  Musculoskeletal: Normal range of motion. No edema and no tenderness.  Neuro: Alert. CN 2-12 grossly intact. No focal deficits. Skin: Skin is warm and dry. No rash noted. Psychiatric: Normal mood and affect.  Her ankle is  wrapped  Labs  Basic Metabolic Panel: No results for input(s): NA, K, CL, CO2, GLUCOSE, BUN, CREATININE, CALCIUM, MG, PHOS in the last 168 hours. Liver Function Tests: No results for input(s): AST, ALT, ALKPHOS, BILITOT, PROT, ALBUMIN in the last 168 hours. No results for input(s): LIPASE, AMYLASE in the last 168 hours.  CBC: Recent Labs  Lab 03/25/2017 1105  WBC 7.0  NEUTROABS 5.1  HGB 12.9  HCT 40.7  MCV 96.5  PLT 205   Cardiac Enzymes: Recent Labs  Lab 04/02/2017 1107  TROPONINI 0.05*   BNP: Invalid input(s): POCBNP CBG: No results for input(s): GLUCAP in the last 168 hours.  Radiological Exams: Dg Ankle Complete Right  Result Date: 03/19/2017 CLINICAL DATA:  Fall on ramp.  Right ankle pain. EXAM: RIGHT ANKLE - COMPLETE 3+ VIEW COMPARISON:  None. FINDINGS: Displaced comminuted fractures are present in the distal fibula and at the medial malleolus. The talus is displaced laterally. The fractures are displaced 1 shaft width. A posterior tibial fractures present as well. Moderate osteopenia is noted. Vascular calcifications are present. IMPRESSION: 1. Comminuted trimalleolar fracture with lateral displacement of the talus. 2. Distal fibular fracture is displaced 1 shaft width. 3. Moderate osteopenia. 4. Microvascular calcifications compatible with known diagnosis of diabetes. Electronically Signed   By: Marin Roberts M.D.   On: 03/23/2017 11:01   Dg Chest Portable 1 View  Result Date: 04/14/2017 CLINICAL DATA:  Preop. EXAM: PORTABLE CHEST 1 VIEW COMPARISON:  None. FINDINGS: Midline trachea. Mild cardiomegaly. Atherosclerosis in the transverse aorta. No pleural effusion or pneumothorax. A right axillary vascular stent. No congestive failure. Clear lungs. IMPRESSION: Cardiomegaly without congestive failure. Aortic Atherosclerosis (ICD10-I70.0).  This is age advanced. Electronically Signed   By: Jeronimo Greaves M.D.   On: 03/20/2017 11:48    EKG: Normal sinus rhythm although  read as ST elevation MI this is consistent with prior EKGs there is no ST elevation MI    Thank you for allowing me to participate in the care of your patient. We will continue to follow.   Note: This dictation was prepared with Dragon dictation along with smaller phrase technology. Any transcriptional errors that result from this process are unintentional.  Time spent: 41 minutes  Americus Scheurich, MD

## 2017-04-01 NOTE — Progress Notes (Signed)
HD tx start 

## 2017-04-01 NOTE — Progress Notes (Signed)
Pre HD assessment  

## 2017-04-01 NOTE — Consult Note (Signed)
Date: 03/31/2017                  Patient Name:  Sarah MuskratLaura Schroeder  MRN: [id number]  DOB: May 08, 1970  Age / Sex: 47 y.o., female         PCP: Nonda LouShapely-Quinn, Todd Lavina, MD                 Service Requesting Consult: Dr. Mody/IM/ Juanell FairlyKrasinski, Kevin, MD                 Reason for Consult: ESRD/Dialysis             History of Present Illness: Patient is a 47 y.o. female with medical problems of ESRD currently receiving HD MWF, Diabetes Mellitus type 2, Hypertension, CAD, CHF, GERD, Gout, and Asthma,previous bilateral toe amputations  who was [facility]2017 for evaluation of right ankle fracture dislocation.  The patient states that she fell on her way to the dialysis center. She has not received dialysis today. Currently does M,W,F in the center in [place]. Her dry weight is 62kg and on Monday she left at 61.6 kg. She has had no complications with dialysis and has a AVG. She is anuric.  She has been on dialysis since 2012 due to ESRD caused by diabetes.  [facility]. Current phosphorus binders are  Calcium acetate and Renvela.    Patient denies SOB. Admits to lower extremity edema.    Potassium elevated at 5.9.      Medications: Outpatient medications:  (Not in a hospital admission)  Current medications: Current Facility-Administered Medications  Medication Dose Route Frequency Provider Last Rate Last Dose  . 0.9 %  sodium chloride infusion   Intravenous Continuous Juanell FairlyKrasinski, Kevin, MD      . bisacodyl (DULCOLAX) EC tablet 5 mg  5 mg Oral Daily PRN Juanell FairlyKrasinski, Kevin, MD      . calcium gluconate 1 g in sodium chloride 0.9 % 100 mL IVPB  1 g Intravenous Once Sharyn CreamerQuale, Mark, MD      . docusate sodium (COLACE) capsule 100 mg  100 mg Oral BID Juanell FairlyKrasinski, Kevin, MD      . insulin aspart (novoLOG) injection 0-9 Units  0-9 Units Subcutaneous TID WC Mody, Sital, MD      . magnesium citrate solution 1 Bottle  1 Bottle Oral Once PRN Juanell FairlyKrasinski, Kevin, MD      . morphine 2  MG/ML injection 2 mg  2 mg Intravenous Q2H PRN Juanell FairlyKrasinski, Kevin, MD      . oxyCODONE (Oxy IR/ROXICODONE) immediate release tablet 5-10 mg  5-10 mg Oral Q3H PRN Juanell FairlyKrasinski, Kevin, MD      . polyethylene glycol (MIRALAX / GLYCOLAX) packet 17 g  17 g Oral Daily PRN Juanell FairlyKrasinski, Kevin, MD      . sodium bicarbonate injection 25 mEq  25 mEq Intravenous Once Sharyn CreamerQuale, Mark, MD       Current Outpatient Medications  Medication Sig Dispense Refill  . allopurinol (ZYLOPRIM) 100 MG tablet Take 100 mg by mouth daily.    Marland Kitchen. amLODipine (NORVASC) 10 MG tablet Take 10 mg by mouth daily.    Marland Kitchen. aspirin 81 MG tablet Take 81 mg by mouth daily.    . calcium acetate, Phos Binder, (PHOSLYRA) 667 MG/5ML SOLN Take 667 mg by mouth 3 (three) times daily with meals.    Marland Kitchen. esomeprazole (NEXIUM) 20 MG capsule Take 20 mg by mouth daily at 12 noon.    . fluconazole (DIFLUCAN)  100 MG tablet Take 100 mg by mouth 3 (three) times a week. Mon. Wed. Friday    . gabapentin (NEURONTIN) 100 MG capsule Take 100 mg by mouth 2 (two) times daily. Alternate with lyrica every month     . insulin glargine (LANTUS) 100 UNIT/ML injection Inject 3 Units into the skin at bedtime.    . insulin lispro (HUMALOG) 100 UNIT/ML injection Inject 1-2 Units into the skin 3 (three) times daily before meals.     Marland Kitchen losartan (COZAAR) 50 MG tablet Take 50 mg by mouth daily.    . metoCLOPramide (REGLAN) 10 MG tablet Take 10 mg by mouth daily.    . metoprolol tartrate (LOPRESSOR) 50 MG tablet Take 50 mg by mouth 2 (two) times daily.    . ondansetron (ZOFRAN) 4 MG tablet Take 4 mg by mouth every 8 (eight) hours as needed for nausea or vomiting.    . potassium chloride (K-DUR) 10 MEQ tablet Take 10 mEq by mouth daily.    Marland Kitchen albuterol (PROVENTIL HFA;VENTOLIN HFA) 108 (90 BASE) MCG/ACT inhaler Inhale 2 puffs into the lungs every 4 (four) hours as needed for wheezing or shortness of breath.    Marland Kitchen gentamicin cream (GARAMYCIN) 0.1 % Apply 1 application topically daily. Mon, Wed,  Fri    . HYDROcodone-acetaminophen (NORCO) 5-325 MG per tablet Take 1 tablet by mouth every 6 (six) hours as needed for moderate pain. (Patient not taking: Reported on Apr 14, 2017) 30 tablet 0  . megestrol (MEGACE) 40 MG tablet Take 40 mg by mouth daily as needed.    . promethazine (PHENERGAN) 25 MG tablet Take 25 mg by mouth every 6 (six) hours as needed for nausea or vomiting.    Marland Kitchen zolpidem (AMBIEN) 5 MG tablet Take 5 mg by mouth at bedtime as needed for sleep.        Allergies: Allergies  Allergen Reactions  . Lisinopril Cough      Past Medical History: Past Medical History:  Diagnosis Date  . Arrhythmia   . Asthma   . CAD (coronary artery disease)   . CHF (congestive heart failure) (HCC)   . Chronic kidney disease   . Dialysis patient (HCC)    Mon, Wed, Fri  . DM (diabetes mellitus), type 2 (HCC)   . DVT (deep venous thrombosis) (HCC)    right arm  . Elevated lipids   . GERD (gastroesophageal reflux disease)   . Gout   . Hypertension   . Osteomyelitis (HCC)    chronic  . PVD (peripheral vascular disease) (HCC)   . Wears dentures    full upper and lower     Past Surgical History: Past Surgical History:  Procedure Laterality Date  . ANKLE SURGERY Bilateral    left(09/13/13), right(07/12/14) [facility]  . AV FISTULA PLACEMENT Right 09/27/2014   Procedure: ARTERIOVENOUS (AV) FISTULA CREATION;  Surgeon: Annice Needy, MD;  Location: [facility] ORS;  Service: Vascular;  Laterality: Right;  . CARDIAC CATHETERIZATION  07/17/2015   [facility] (x1)  . CARPAL TUNNEL RELEASE Right 06/23/2016   [facility]  . ENDOMETRIAL ABLATION  2009  . pd catheter insertion  2013  . PERITONEAL CATHETER REMOVAL  09/30/2015   [facility]  . stents in legs Bilateral   . TOE AMPUTATION Bilateral   . 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 TRIGGER FINGER RELEASE Right 06/23/2016   [facility]     Family History: No family history on file.   Social History: Social History  Socioeconomic  History  . Marital status: Single    Spouse name: Not on file  . Number of children: Not on file  . Years of education: Not on file  . Highest education level: Not on file  Social Needs  . Financial resource strain: Not on file  . Food insecurity - worry: Not on file  . Food insecurity - inability: Not on file  . Transportation needs - medical: Not on file  . Transportation needs - non-medical: Not on file  Occupational History  . Not on file  Tobacco Use  . Smoking status: Never Smoker  . Smokeless tobacco: Never Used  Substance and Sexual Activity  . Alcohol use: No  . Drug use: No  . Sexual activity: Not on file  Other Topics Concern  . Not on file  Social History Narrative  . Not on file     Review of Systems: Gen: No fevers, chills  HEENT: No complaints  CV: No chest pain  Resp: No SOB  GI: No abdominal pain, N,V  GU : admits to anuria  MS: right ankle pain, edema   Derm:  No complaints  Psych: No complaints  Heme: No complaints  Neuro: No complaints  Endocrine: No complaints   Vital Signs: Blood pressure (!) 189/99, pulse 93, temperature 98.2 F (36.8 C), temperature source Oral, resp. rate 15, height 5\' 4"  (1.626 m), weight 59 kg (130 lb), SpO2 97 %.  No intake or output data in the 24 hours ending 04-08-2017 1358  Weight trends: Filed Weights   2017/04/08 1035  Weight: 59 kg (130 lb)    Physical Exam: General:  Patient is lying comfortably in bed   HEENT Normocephalic, atraumatic   Neck:  supple, no venous distention   Lungs: Clear to auscultation   Heart::  regular rate/rhythm   Abdomen: Soft, NT   Extremities:  trace edema, Bilateral Transmetatarsal, right ankle splint   Neurologic: Alert and oriented   Skin: Intact.    Access: Right AVG, good thrill          Lab results: Basic Metabolic Panel: Recent Labs  Lab 04/08/17 1224  NA 138  K 5.9*  CL 96*  CO2 26  GLUCOSE 114*  BUN 53*  CREATININE 8.11*  CALCIUM 9.4    Liver Function  Tests: Recent Labs  Lab 04-08-17 1224  AST 18  ALT 15  ALKPHOS 148*  BILITOT 0.7  PROT 7.9  ALBUMIN 3.7   No results for input(s): LIPASE, AMYLASE in the last 168 hours. No results for input(s): AMMONIA in the last 168 hours.  CBC: Recent Labs  Lab 2017/04/08 1105  WBC 7.0  NEUTROABS 5.1  HGB 12.9  HCT 40.7  MCV 96.5  PLT 205    Cardiac Enzymes: Recent Labs  Lab 04-08-17 1224  TROPONINI 0.06*    BNP: Invalid input(s): POCBNP  CBG: No results for input(s): GLUCAP in the last 168 hours.  Microbiology: No results found for this or any previous visit (from the past 720 hour(s)).   Coagulation Studies: No results for input(s): LABPROT, INR in the last 72 hours.  Urinalysis: No results for input(s): COLORURINE, LABSPEC, PHURINE, GLUCOSEU, HGBUR, BILIRUBINUR, KETONESUR, PROTEINUR, UROBILINOGEN, NITRITE, LEUKOCYTESUR in the last 72 hours.  Invalid input(s): APPERANCEUR      Imaging: Dg Ankle Complete Right  Result Date: 04-08-17 CLINICAL DATA:  Fall on ramp.  Right ankle pain. EXAM: RIGHT ANKLE - COMPLETE 3+ VIEW COMPARISON:  None. FINDINGS: Displaced comminuted fractures  are present in the distal fibula and at the medial malleolus. The talus is displaced laterally. The fractures are displaced 1 shaft width. A posterior tibial fractures present as well. Moderate osteopenia is noted. Vascular calcifications are present. IMPRESSION: 1. Comminuted trimalleolar fracture with lateral displacement of the talus. 2. Distal fibular fracture is displaced 1 shaft width. 3. Moderate osteopenia. 4. Microvascular calcifications compatible with known diagnosis of diabetes. Electronically Signed   By: Marin Roberts M.D.   On: 12-Apr-2017 11:01   Dg Chest Portable 1 View  Result Date: 04/12/17 CLINICAL DATA:  Preop. EXAM: PORTABLE CHEST 1 VIEW COMPARISON:  None. FINDINGS: Midline trachea. Mild cardiomegaly. Atherosclerosis in the transverse aorta. No pleural effusion or  pneumothorax. A right axillary vascular stent. No congestive failure. Clear lungs. IMPRESSION: Cardiomegaly without congestive failure. Aortic Atherosclerosis (ICD10-I70.0).  This is age advanced. Electronically Signed   By: Jeronimo Greaves M.D.   On: 04/12/17 11:48   Dg Ankle Right Port  Result Date: 04/12/17 CLINICAL DATA:  Post reduction in plaster images EXAM: PORTABLE RIGHT ANKLE - 2 VIEW COMPARISON:  Pre reduction ankle series of today's date FINDINGS: The patient has undergone close reduction of the comminuted trimalleolar fracture. There remains some disruption of the ankle joint mortise with lateral migration of the talar dome with respect to the tibial plafond. The distal fibular fracture has been partially reduced as has the medial malleolar fracture. Involving the distal IMPRESSION: Partial reduction of the trimalleolar fracture of the right ankle with persistent disruption of the ankle joint mortise. Electronically Signed   By: David  [place] M.D.   On: 12-Apr-2017 13:24      Assessment & Plan: Pt is a 47 y.o. African American  female with medical problems of ESRD currently receiving HD MWF, Diabetes Mellitus type 2, Hypertension, CAD, CHF, GERD, Gout, and Asthma, previous bilateral toe amputations, was admitted on 12-Apr-2017 with an ankle fracture post fall.  [facility] [place]/ [facility]/ EDW 62 kg/ MWF  1. ESRD currently on Dialysis M,W,F  Dialyze today after admission.  2. Hyperkalemia  Potassium elevated at 5.9.  Consider d/c outpatient Kcl  3. Right ankle fracture post fall  Managed by orthopedic team.   4. Anemia of chronic kidney disease.  EPO not to be administered at this time due to Hgb of 12.9 (above goal)  5. SHPTH - monitor phos May resume home dose of binders with meals

## 2017-04-01 NOTE — Progress Notes (Signed)
Inpatient Diabetes Program Recommendations  AACE/ADA: New Consensus Statement on Inpatient Glycemic Control (2015)  Target Ranges:  Prepandial:   less than 140 mg/dL      Peak postprandial:   less than 180 mg/dL (1-2 hours)      Critically ill patients:  140 - 180 mg/dL   Results for Sarah Schroeder, Sarah Schroeder (MRN [id number]) as of 2017/09/18 14:55  Ref. Range 2017/09/18 12:24  Glucose Latest Ref Range: 65 - 99 mg/dL 962114 (H)   Review of Glycemic Control  Diabetes history: DM2 hx (per Endocrinologist note patient is treated as DM1) Outpatient Diabetes medications: Lantus 3 units QHS, Humalog 1-2 units TID with meals Current orders for Inpatient glycemic control: Novolog 0-9 units TID with meals  Inpatient Diabetes Program Recommendations: Insulin - Basal: Please consider ordering Levemir 2 units Q24H. Correction (SSI): Please consider decreasing Novolog correction scale to Novolog 0-5 units custom scale ACHS or Q4H if patient will remain NPO. -Custom Novolog correction scale 0-5 units ACHS (or Q4H if pt will remain NPO)      151-200  1 unit      201-250  2 units      251-300  3 units      301-350  4 units      351-400  5 units A1C: Please consider ordering an A1C to evaluate glycemic control over the past 2-3 months.  NOTE: Noted consult for Diabetes Coordinator. Patient is currently in ER. Chart reviewed. [facility] (Dr. Everette RankJelesoff) and was last seen on 02/19/17 at which time patient was instructed to take Lantus 2 units daily, Humalog 1 unit with breakfast, 2 units with lunch and supper. According to office notes by Dr. Everette RankJelesoff, patient is managed as DM1.  Patient is very sensitive to insulin. Would recommend ordering very low dose basal insulin daily and using custom scale as noted above and ordering an A1C. Will continue to follow and make further recommendations as more data is collected.  Thanks, Sarah PennerMarie Tyce Delcid, RN, MSN, CDE Diabetes Coordinator Inpatient Diabetes  Program [phone] (Team Pager from 8am to 5pm)

## 2017-04-01 NOTE — ED Provider Notes (Signed)
EKG is reviewed and her by me at 11:15 AM Heart rate 95 QRS 80 QTC 460 Normal sinus rhythm, there is notable peaking of the T waves across the anterior precordial leads.  The patient is not expressing chest pain, however she is currently pending evaluation for her potassium.  Hyperkalemia is certainly considered given this EKG.  Patient is ESRD patient, on way to dialysis slipped and fell. Due for HD today. Awaiting BMP being sent out at this time.   Medical screening examination/treatment/procedure(s) were conducted as a shared visit with non-physician practitioner(s) and myself.  I personally evaluated the patient during the encounter.  Trimalleolar fracture. Ortho consult. Obtain chemistry ? Hyperkalemia.   Patient has intact dorsalis pedis pulse in the right foot.  Loss of sensation with numbness from about the ankle down but equal bilateral she reports neuropathy.  Does not wish for any pain medicine.  Dr. Martha ClanKrasinski consulted, will see patient shortly and request we put her in a temporary splint until he can see and evaluate in the ER.  There is no tenting of the skin, no open fracture.  ----------------------------------------- 1:09 PM on September 27, 2017 -----------------------------------------  CRITICAL CARE Performed by: Sharyn CreamerMark Quale   Total critical care time: 40 minutes  Critical care time was exclusive of separately billable procedures and treating other patients.  Critical care was necessary to treat or prevent imminent or life-threatening deterioration.  Critical care was time spent personally by me on the following activities: development of treatment plan with patient and/or surrogate as well as nursing, discussions with consultants, evaluation of patient's response to treatment, examination of patient, obtaining history from patient or surrogate, ordering and performing treatments and interventions, ordering and review of laboratory studies, ordering and review of radiographic  studies, pulse oximetry and re-evaluation of patient's condition.  Hyperkalemia with peaked T waves requiring nephrology consultation, treatment with bicarbonate and calcium gluconate.   Patient's potassium resulted, greater than 5 with associated peak T waves on EKG.  No ectopy, but have paged nephrology to request urgent consultation for consideration of hemodialysis, I have also ordered calcium gluconate and bicarbonate infusion for potassium lowering in the interim as we await nephrology.  Impression: Closed malleolar fracture right ankle Hyperkalemia Electrocardiogram showing T wave abnormalities  Clinical Course as of Apr 01 1345  Wed Apr 01, 2017  1306 Noted. Nephrology consult team paged.  Potassium: (!) 5.9 [MQ]  1343 Nephrology re-paged.  [MQ]  1346 Dr. Thedore MinsSingh aware, reports currently working on arranging HD for patient.   [MQ]    Clinical Course User Index [MQ] Sharyn CreamerQuale, Mark, MD       Sharyn CreamerQuale, Mark, MD October 25, 2017 [phone]

## 2017-04-01 NOTE — H&P (Signed)
PREOPERATIVE H&P  Chief Complaint: RIGHT ANKLE FRACTURE  HPI: Sarah Schroeder is a 47 y.o. female who presents for preoperative history and physical with a diagnosis of RIGHT ANKLE FRACTURE DISLOCATION. Patient sustained fall at home today when she slipped on ice coming out of her house.  She had pain and deformity to the ankle and was [facility] EMS.   Patient is dialysis dependent with a history of cardiac disease, chronic kidney disease and peripheral vascular disease with a transmetatarsal amputation on the injured side.  Hospitalist service requested orthopaedic admission despite medical history.  Past Medical History:  Diagnosis Date  . Arrhythmia   . Asthma   . CAD (coronary artery disease)   . CHF (congestive heart failure) (HCC)   . Chronic kidney disease   . Dialysis patient (HCC)    Mon, Wed, Fri  . DM (diabetes mellitus), type 2 (HCC)   . DVT (deep venous thrombosis) (HCC)    right arm  . Elevated lipids   . GERD (gastroesophageal reflux disease)   . Gout   . Hypertension   . Osteomyelitis (HCC)    chronic  . PVD (peripheral vascular disease) (HCC)   . Wears dentures    full upper and lower   Past Surgical History:  Procedure Laterality Date  . ANKLE SURGERY Bilateral    left(09/13/13), right(07/12/14) [facility]  . AV FISTULA PLACEMENT Right 09/27/2014   Procedure: ARTERIOVENOUS (AV) FISTULA CREATION;  Surgeon: Annice NeedyJason S Dew, MD;  Location: [facility] ORS;  Service: Vascular;  Laterality: Right;  . CARDIAC CATHETERIZATION  07/17/2015   [facility] (x1)  . CARPAL TUNNEL RELEASE Right 06/23/2016   [facility]  . ENDOMETRIAL ABLATION  2009  . pd catheter insertion  2013  . PERITONEAL CATHETER REMOVAL  09/30/2015   [facility]  . stents in legs Bilateral   . TOE AMPUTATION Bilateral   . 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 TRIGGER FINGER RELEASE Right 06/23/2016   [facility]   Social History   Socioeconomic History  . Marital status: Single    Spouse name:  None  . Number of children: None  . Years of education: None  . Highest education level: None  Social Needs  . Financial resource strain: None  . Food insecurity - worry: None  . Food insecurity - inability: None  . Transportation needs - medical: None  . Transportation needs - non-medical: None  Occupational History  . None  Tobacco Use  . Smoking status: Never Smoker  . Smokeless tobacco: Never Used  Substance and Sexual Activity  . Alcohol use: No  . Drug use: No  . Sexual activity: None  Other Topics Concern  . None  Social History Narrative  . None   No family history on file. Allergies  Allergen Reactions  . Lisinopril Cough   Prior to Admission medications   Medication Sig Start Date End Date Taking? Authorizing Provider  allopurinol (ZYLOPRIM) 100 MG tablet Take 100 mg by mouth daily.   Yes [provider]  amLODipine (NORVASC) 10 MG tablet Take 10 mg by mouth daily.   Yes [provider]  aspirin 81 MG tablet Take 81 mg by mouth daily.   Yes [provider]  calcium acetate, Phos Binder, (PHOSLYRA) 667 MG/5ML SOLN Take 667 mg by mouth 3 (three) times daily with meals.   Yes [provider]  esomeprazole (NEXIUM) 20 MG capsule Take 20 mg by mouth daily at 12 noon.  Yes [provider]  fluconazole (DIFLUCAN) 100 MG tablet Take 100 mg by mouth 3 (three) times a week. Mon. Wed. Friday   Yes [provider]  gabapentin (NEURONTIN) 100 MG capsule Take 100 mg by mouth 2 (two) times daily. Alternate with lyrica every month    Yes [provider]  insulin glargine (LANTUS) 100 UNIT/ML injection Inject 3 Units into the skin at bedtime.   Yes [provider]  insulin lispro (HUMALOG) 100 UNIT/ML injection Inject 1-2 Units into the skin 3 (three) times daily before meals.    Yes [provider]  losartan (COZAAR) 50 MG tablet Take 50 mg by mouth daily.   Yes [provider]   metoCLOPramide (REGLAN) 10 MG tablet Take 10 mg by mouth daily.   Yes [provider]  metoprolol tartrate (LOPRESSOR) 50 MG tablet Take 50 mg by mouth 2 (two) times daily.   Yes [provider]  ondansetron (ZOFRAN) 4 MG tablet Take 4 mg by mouth every 8 (eight) hours as needed for nausea or vomiting.   Yes [provider]  potassium chloride (K-DUR) 10 MEQ tablet Take 10 mEq by mouth daily.   Yes [provider]  albuterol (PROVENTIL HFA;VENTOLIN HFA) 108 (90 BASE) MCG/ACT inhaler Inhale 2 puffs into the lungs every 4 (four) hours as needed for wheezing or shortness of breath.    [provider]  gentamicin cream (GARAMYCIN) 0.1 % Apply 1 application topically daily. Mon, Wed, Fri    [provider]  HYDROcodone-acetaminophen (NORCO) 5-325 MG per tablet Take 1 tablet by mouth every 6 (six) hours as needed for moderate pain. Patient not taking: Reported on 04/12/2017 09/27/14   Annice Needy, MD  megestrol (MEGACE) 40 MG tablet Take 40 mg by mouth daily as needed.    [provider]  promethazine (PHENERGAN) 25 MG tablet Take 25 mg by mouth every 6 (six) hours as needed for nausea or vomiting.    [provider]  zolpidem (AMBIEN) 5 MG tablet Take 5 mg by mouth at bedtime as needed for sleep.    [provider]     Positive ROS: All other systems have been reviewed and were otherwise negative with the exception of those mentioned in the HPI and as above.  Physical Exam: General: Alert, no acute distress Cardiovascular: Regular rate and rhythm, no murmurs rubs or gallops.  No pedal edema Respiratory: Clear to auscultation bilaterally, no wheezes rales or rhonchi. No cyanosis, no use of accessory musculature GI: No organomegaly, abdomen is soft and non-tender nondistended with positive bowel sounds. Skin: Skin intact, no lesions within the operative field. Neurologic: Sensation intact distally Psychiatric: Patient  is competent for consent with normal mood and affect Lymphatic: No  cervical lymphadenopathy  MUSCULOSKELETAL: Right ankle:  Valgus deformity.   Healed transmetatarsal amputation (surgery at [facility]).  Intact skin.   Intact sensation to light touch.  Foot perfused.  Pedal pulses palpable.   Assessment: CLOSED RIGHT ANKLE FRACTURE DISLOCATION  Plan: Plan for Procedure(s): OPEN REDUCTION INTERNAL FIXATION (ORIF) RIGHT ANKLE FRACTURE DISLOCATION  A closed reduction of the right ankle was performed in the ER with an intraarticular ankle block.  Patient was placed in an AO splint.   Post reduction xrays are pending.  Patient has EKG abnormalities in ER.   Lab work and hospitalist input pending.    Patient will require ORIF for treatment of her ankle fracture.  I discussed the procedure with the patient.  Surgery is scheduled for tomorrow at 11 AM pending medical clearance.    I discussed the risks and benefits of surgery. The risks include but are not limited to infection, bleeding requiring blood transfusion, nerve or blood vessel injury, joint stiffness or loss of motion, persistent pain, weakness or instability, malunion, nonunion and hardware failure and the need for further surgery. Medical risks include but are not limited to DVT and pulmonary embolism, myocardial infarction, stroke, pneumonia, respiratory failure and death. Patient understood these risks and wished to proceed.    Juanell Fairly, MD   03/22/2017 1:00 PM

## 2017-04-01 NOTE — ED Notes (Signed)
Admitting MD at bedside.

## 2017-04-01 NOTE — ED Notes (Signed)
Last meal was aprox 0830, pt had cereal for breakfast

## 2017-04-01 NOTE — ED Notes (Signed)
Orthopedist MD at the bedside with ortho tech. Foot has been put back into place and splinted. Family updated.

## 2017-04-01 NOTE — Progress Notes (Signed)
Post HD assessment  

## 2017-04-01 NOTE — ED Provider Notes (Signed)
[facility] Emergency Department Provider Note  ___________________________________________   First MD Initiated Contact with Patient 04-Apr-2017 1039     (approximate)  I have reviewed the triage vital signs and the nursing notes.   HISTORY  Chief Complaint Ankle Pain   HPI Sarah Schroeder is a 47 y.o. female is bright is brought in via EMS from home after patient had a mechanical fall.  Patient states that she did not know that there was [employer] on the ramp causing her to slide.  Patient denies any loss of consciousness or any other injuries.  Patient has an injury to her right ankle with positive deformity.  She is a dialysis patient and was on her way to dialysis prior to her fall.  Her last treatment was Monday.  She last ate at approximately 8 AM.  Patient states that she has neuropathy in her foot and does not normally have much feeling.  She states that the feeling in her foot is the same as on the left side.  She also has had digits amputated.  Patient has history of peripheral vascular disease, DVT, CHF, chronic kidney disease, hypertension, diabetes type 2 and diabetic neuropathy.   Past Medical History:  Diagnosis Date  . Arrhythmia   . Asthma   . CAD (coronary artery disease)   . CHF (congestive heart failure) (HCC)   . Chronic kidney disease   . Dialysis patient (HCC)    Mon, Wed, Fri  . DM (diabetes mellitus), type 2 (HCC)   . DVT (deep venous thrombosis) (HCC)    right arm  . Elevated lipids   . GERD (gastroesophageal reflux disease)   . Gout   . Hypertension   . Osteomyelitis (HCC)    chronic  . PVD (peripheral vascular disease) (HCC)   . Wears dentures    full upper and lower    There are no active problems to display for this patient.   Past Surgical History:  Procedure Laterality Date  . ANKLE SURGERY Bilateral    left(09/13/13), right(07/12/14) [facility]  . AV FISTULA PLACEMENT Right 09/27/2014   Procedure: ARTERIOVENOUS (AV) FISTULA  CREATION;  Surgeon: Annice Needy, MD;  Location: [facility] ORS;  Service: Vascular;  Laterality: Right;  . CARDIAC CATHETERIZATION  07/17/2015   [facility] (x1)  . CARPAL TUNNEL RELEASE Right 06/23/2016   [facility]  . ENDOMETRIAL ABLATION  2009  . pd catheter insertion  2013  . PERITONEAL CATHETER REMOVAL  09/30/2015   [facility]  . stents in legs Bilateral   . TOE AMPUTATION Bilateral   . 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 TRIGGER FINGER RELEASE Right 06/23/2016   [facility]    Prior to Admission medications   Medication Sig Start Date End Date Taking? Authorizing Provider  allopurinol (ZYLOPRIM) 100 MG tablet Take 100 mg by mouth daily.   Yes [provider]  amLODipine (NORVASC) 10 MG tablet Take 10 mg by mouth daily.   Yes [provider]  aspirin 81 MG tablet Take 81 mg by mouth daily.   Yes [provider]  calcium acetate, Phos Binder, (PHOSLYRA) 667 MG/5ML SOLN Take 667 mg by mouth 3 (three) times daily with meals.   Yes [provider]  esomeprazole (NEXIUM) 20 MG capsule Take 20 mg by mouth daily at 12 noon.   Yes [provider]  fluconazole (DIFLUCAN) 100 MG tablet Take 100 mg by mouth 3 (three) times a week. Mon. Wed. Friday  Yes [provider]  gabapentin (NEURONTIN) 100 MG capsule Take 100 mg by mouth 2 (two) times daily. Alternate with lyrica every month    Yes [provider]  insulin glargine (LANTUS) 100 UNIT/ML injection Inject 3 Units into the skin at bedtime.   Yes [provider]  insulin lispro (HUMALOG) 100 UNIT/ML injection Inject 1-2 Units into the skin 3 (three) times daily before meals.    Yes [provider]  losartan (COZAAR) 50 MG tablet Take 50 mg by mouth daily.   Yes [provider]  metoCLOPramide (REGLAN) 10 MG tablet Take 10 mg by mouth daily.   Yes [provider]  metoprolol tartrate (LOPRESSOR) 50 MG tablet Take 50 mg by mouth 2 (two) times  daily.   Yes [provider]  ondansetron (ZOFRAN) 4 MG tablet Take 4 mg by mouth every 8 (eight) hours as needed for nausea or vomiting.   Yes [provider]  potassium chloride (K-DUR) 10 MEQ tablet Take 10 mEq by mouth daily.   Yes [provider]  albuterol (PROVENTIL HFA;VENTOLIN HFA) 108 (90 BASE) MCG/ACT inhaler Inhale 2 puffs into the lungs every 4 (four) hours as needed for wheezing or shortness of breath.    [provider]  gentamicin cream (GARAMYCIN) 0.1 % Apply 1 application topically daily. Mon, Wed, Fri    [provider]  HYDROcodone-acetaminophen (NORCO) 5-325 MG per tablet Take 1 tablet by mouth every 6 (six) hours as needed for moderate pain. Patient not taking: Reported on 04/13/2017 09/27/14   Annice Needyew, Jason S, MD  megestrol (MEGACE) 40 MG tablet Take 40 mg by mouth daily as needed.    [provider]  promethazine (PHENERGAN) 25 MG tablet Take 25 mg by mouth every 6 (six) hours as needed for nausea or vomiting.    [provider]  zolpidem (AMBIEN) 5 MG tablet Take 5 mg by mouth at bedtime as needed for sleep.    [provider]    Allergies Lisinopril  No family history on file.  Social History Social History   Tobacco Use  . Smoking status: Never Smoker  . Smokeless tobacco: Never Used  Substance Use Topics  . Alcohol use: No  . Drug use: No    Review of Systems Constitutional: No fever/chills ENT: No injury. Cardiovascular: Denies chest pain. Respiratory: Denies shortness of breath. Gastrointestinal: No abdominal pain.  No nausea, no vomiting.  Musculoskeletal: Positive for right ankle injury. Skin: Negative for rash. Neurological: Negative for headache or head injury. Endocrine:Positive diabetes type 2. ____________________________________________   PHYSICAL EXAM:  VITAL SIGNS: ED Triage Vitals [03/30/2017 1035]  Enc Vitals Group     BP (!) 187/87     Pulse Rate 91     Resp 16       Temp 98.2 F (36.8 C)     Temp Source Oral     SpO2 96 %     Weight 130 lb (59 kg)     Height 5\' 4"  (1.626 m)     Head Circumference      Peak Flow      Pain Score 0     Pain Loc      Pain Edu?      Excl. in GC?    Constitutional: Alert and oriented. Well appearing and in no acute distress.  Patient is pleasant and cooperative. Eyes: Conjunctivae are normal. PERRL. EOMI. Head: Atraumatic. Neck: No stridor. Nontender cervical  spine to palpation posteriorly. Cardiovascular:  Normal rate, regular rhythm. Grossly normal heart sounds.  Good peripheral circulation. Respiratory: Normal respiratory effort.  No retractions. Lungs CTAB. Gastrointestinal: Soft and nontender. No distention. Musculoskeletal: On examination of her right ankle there is obvious deformity bilaterally with soft tissue swelling.  Skin is warm and dry.  Digits have been surgically removed.  No open wound or tenting.  Sensation is equal in comparison to the left foot.  Vascular intact.  Patient has no tenderness on palpation of the knee or hip.  She denies any back pain. Neurologic:  Normal speech and language. No gross focal neurologic deficits are appreciated. Skin:  Skin is warm, dry and intact.  No ecchymosis or abrasions are seen. Psychiatric: Mood and affect are normal. Speech and behavior are normal.  ____________________________________________   LABS (all labs ordered are listed, but only abnormal results are displayed)  Labs Reviewed  CBC WITH DIFFERENTIAL/PLATELET - Abnormal; Notable for the following components:      Result Value   MCHC 31.7 (*)    RDW 16.1 (*)    All other components within normal limits  TROPONIN I - Abnormal; Notable for the following components:   Troponin I 0.05 (*)    All other components within normal limits   ____________________________________________  EKG  Per Dr. Fanny Bien ____________________________________________  RADIOLOGY  Dg Ankle Complete Right  Result  Date: 05-01-17 CLINICAL DATA:  Fall on ramp.  Right ankle pain. EXAM: RIGHT ANKLE - COMPLETE 3+ VIEW COMPARISON:  None. FINDINGS: Displaced comminuted fractures are present in the distal fibula and at the medial malleolus. The talus is displaced laterally. The fractures are displaced 1 shaft width. A posterior tibial fractures present as well. Moderate osteopenia is noted. Vascular calcifications are present. IMPRESSION: 1. Comminuted trimalleolar fracture with lateral displacement of the talus. 2. Distal fibular fracture is displaced 1 shaft width. 3. Moderate osteopenia. 4. Microvascular calcifications compatible with known diagnosis of diabetes. Electronically Signed   By: Marin Roberts M.D.   On: 05-01-2017 11:01   Dg Chest Portable 1 View  Result Date: 05-01-17 CLINICAL DATA:  Preop. EXAM: PORTABLE CHEST 1 VIEW COMPARISON:  None. FINDINGS: Midline trachea. Mild cardiomegaly. Atherosclerosis in the transverse aorta. No pleural effusion or pneumothorax. A right axillary vascular stent. No congestive failure. Clear lungs. IMPRESSION: Cardiomegaly without congestive failure. Aortic Atherosclerosis (ICD10-I70.0).  This is age advanced. Electronically Signed   By: Jeronimo Greaves M.D.   On: May 01, 2017 11:48    ____________________________________________   PROCEDURES  Procedure(s) performed: None  Procedures  Critical Care performed: No  ____________________________________________   INITIAL IMPRESSION / ASSESSMENT AND PLAN / ED COURSE Dr. Martha Clan is the orthopedist on call and will be seeing the patient in the emergency department.  Patient was transferred from flex room 53 to major room 2 and Dr. Fanny Bien has resumed care of this patient.  Lab work was pending at the time of her transfer to major.   ____________________________________________   FINAL CLINICAL IMPRESSION(S) / ED DIAGNOSES  Final diagnoses:  Closed trimalleolar fracture of right ankle, initial encounter      ED Discharge Orders    None       Note:  This document was prepared using Dragon voice recognition software and may include unintentional dictation errors.    Tommi Rumps, PA-C 05-01-17 1200    Jeanmarie Plant, MD 2017/05/01 [phone]

## 2017-04-01 NOTE — Progress Notes (Signed)
Patient admitted to room 141. Patient is alert and oriented X 4. Patient verbalizes minimal pain to R ankle. Admission and skin assessment completed. Pt has R upper arm fistula. BP 122/65. Patient placed on telemetry.  Patient going to HD Consents have been signed.

## 2017-04-01 NOTE — Progress Notes (Signed)
HD tx end  

## 2017-04-01 NOTE — ED Triage Notes (Signed)
Pt to ED via EMS from home , pt had mechanical fall this am down a ramp at home and c/o RT ankle injury. + Deformity is noted. PT is a dialysis pt and was on her way to receive dialysis this am before fall. Last tx was Monday. Pt denies any other injuries at this time.

## 2017-04-02 ENCOUNTER — Inpatient Hospital Stay: Payer: Medicare Other | Admitting: Anesthesiology

## 2017-04-02 ENCOUNTER — Inpatient Hospital Stay: Payer: Medicare Other

## 2017-04-02 ENCOUNTER — Encounter: Admission: EM | Disposition: E | Payer: Self-pay | Source: Home / Self Care | Attending: Orthopedic Surgery

## 2017-04-02 ENCOUNTER — Other Ambulatory Visit: Payer: Self-pay

## 2017-04-02 ENCOUNTER — Encounter: Payer: Self-pay | Admitting: Anesthesiology

## 2017-04-02 DIAGNOSIS — I469 Cardiac arrest, cause unspecified: Secondary | ICD-10-CM

## 2017-04-02 DIAGNOSIS — I213 ST elevation (STEMI) myocardial infarction of unspecified site: Secondary | ICD-10-CM

## 2017-04-02 DIAGNOSIS — I462 Cardiac arrest due to underlying cardiac condition: Secondary | ICD-10-CM | POA: Diagnosis not present

## 2017-04-02 HISTORY — PX: ORIF ANKLE FRACTURE: SHX5408

## 2017-04-02 LAB — BLOOD GAS, ARTERIAL
ACID-BASE EXCESS: 11.1 mmol/L — AB (ref 0.0–2.0)
Acid-Base Excess: 21.4 mmol/L — ABNORMAL HIGH (ref 0.0–2.0)
BICARBONATE: 33.9 mmol/L — AB (ref 20.0–28.0)
Bicarbonate: 47.1 mmol/L — ABNORMAL HIGH (ref 20.0–28.0)
FIO2: 100
FIO2: 0.6
LHR: 20 {breaths}/min
MECHVT: 500 mL
O2 SAT: 99.6 %
O2 Saturation: 99.7 %
PATIENT TEMPERATURE: 37
PATIENT TEMPERATURE: 37
PCO2 ART: 37 mmHg (ref 32.0–48.0)
PEEP/CPAP: 5 cmH2O
PEEP: 5 cmH2O
PH ART: 7.57 — AB (ref 7.350–7.450)
PO2 ART: 182 mmHg — AB (ref 83.0–108.0)
RATE: 20 resp/min
VT: 500 mL
pCO2 arterial: 59 mmHg — ABNORMAL HIGH (ref 32.0–48.0)
pH, Arterial: 7.51 — ABNORMAL HIGH (ref 7.350–7.450)
pO2, Arterial: 160 mmHg — ABNORMAL HIGH (ref 83.0–108.0)

## 2017-04-02 LAB — PROTIME-INR
INR: 1.11
INR: 1.5
PROTHROMBIN TIME: 18 s — AB (ref 11.4–15.2)
Prothrombin Time: 14.2 seconds (ref 11.4–15.2)

## 2017-04-02 LAB — BASIC METABOLIC PANEL
ANION GAP: 38 — AB (ref 5–15)
BUN: 40 mg/dL — ABNORMAL HIGH (ref 6–20)
CO2: 28 mmol/L (ref 22–32)
Calcium: 8 mg/dL — ABNORMAL LOW (ref 8.9–10.3)
Chloride: 92 mmol/L — ABNORMAL LOW (ref 101–111)
Creatinine, Ser: 5.87 mg/dL — ABNORMAL HIGH (ref 0.44–1.00)
GFR, EST AFRICAN AMERICAN: 9 mL/min — AB (ref >60)
GFR, EST NON AFRICAN AMERICAN: 8 mL/min — AB (ref >60)
Glucose, Bld: 223 mg/dL — ABNORMAL HIGH (ref 65–99)
POTASSIUM: 4.1 mmol/L (ref 3.5–5.1)
Sodium: 158 mmol/L — ABNORMAL HIGH (ref 135–145)

## 2017-04-02 LAB — COMPREHENSIVE METABOLIC PANEL
ALBUMIN: 2 g/dL — AB (ref 3.5–5.0)
ALT: 291 U/L — ABNORMAL HIGH (ref 14–54)
ANION GAP: 33 — AB (ref 5–15)
AST: 317 U/L — AB (ref 15–41)
Alkaline Phosphatase: 80 U/L (ref 38–126)
BUN: 34 mg/dL — AB (ref 6–20)
CHLORIDE: 87 mmol/L — AB (ref 101–111)
CO2: 49 mmol/L — AB (ref 22–32)
Calcium: 6.7 mg/dL — ABNORMAL LOW (ref 8.9–10.3)
Creatinine, Ser: 5.19 mg/dL — ABNORMAL HIGH (ref 0.44–1.00)
GFR calc Af Amer: 10 mL/min — ABNORMAL LOW (ref >60)
GFR calc non Af Amer: 9 mL/min — ABNORMAL LOW (ref >60)
GLUCOSE: 193 mg/dL — AB (ref 65–99)
POTASSIUM: 4.4 mmol/L (ref 3.5–5.1)
SODIUM: 169 mmol/L — AB (ref 135–145)
Total Bilirubin: 0.9 mg/dL (ref 0.3–1.2)
Total Protein: 4.2 g/dL — ABNORMAL LOW (ref 6.5–8.1)

## 2017-04-02 LAB — CBC
HCT: 25.6 % — ABNORMAL LOW (ref 35.0–47.0)
HEMATOCRIT: 35.4 % (ref 35.0–47.0)
HEMOGLOBIN: 11.4 g/dL — AB (ref 12.0–16.0)
Hemoglobin: 8.2 g/dL — ABNORMAL LOW (ref 12.0–16.0)
MCH: 31.2 pg (ref 26.0–34.0)
MCH: 31.1 pg (ref 26.0–34.0)
MCHC: 32.2 g/dL (ref 32.0–36.0)
MCHC: 32 g/dL (ref 32.0–36.0)
MCV: 97.1 fL (ref 80.0–100.0)
MCV: 97.1 fL (ref 80.0–100.0)
Platelets: 204 10*3/uL (ref 150–440)
Platelets: 142 10*3/uL — ABNORMAL LOW (ref 150–440)
RBC: 3.65 MIL/uL — ABNORMAL LOW (ref 3.80–5.20)
RBC: 2.64 MIL/uL — ABNORMAL LOW (ref 3.80–5.20)
RDW: 16 % — ABNORMAL HIGH (ref 11.5–14.5)
RDW: 16 % — AB (ref 11.5–14.5)
WBC: 9.3 10*3/uL (ref 3.6–11.0)
WBC: 10.4 10*3/uL (ref 3.6–11.0)

## 2017-04-02 LAB — PREPARE RBC (CROSSMATCH)

## 2017-04-02 LAB — HCG, QUANTITATIVE, PREGNANCY: hCG, Beta Chain, Quant, S: 6 m[IU]/mL — ABNORMAL HIGH (ref <5)

## 2017-04-02 LAB — GLUCOSE, CAPILLARY
GLUCOSE-CAPILLARY: 152 mg/dL — AB (ref 65–99)
GLUCOSE-CAPILLARY: 73 mg/dL (ref 65–99)
GLUCOSE-CAPILLARY: 106 mg/dL — AB (ref 65–99)
GLUCOSE-CAPILLARY: 146 mg/dL — AB (ref 65–99)
GLUCOSE-CAPILLARY: 417 mg/dL — AB (ref 65–99)
GLUCOSE-CAPILLARY: 184 mg/dL — AB (ref 65–99)
Glucose-Capillary: 154 mg/dL — ABNORMAL HIGH (ref 65–99)
Glucose-Capillary: 19 mg/dL — CL (ref 65–99)
Glucose-Capillary: 52 mg/dL — ABNORMAL LOW (ref 65–99)
Glucose-Capillary: 203 mg/dL — ABNORMAL HIGH (ref 65–99)
Glucose-Capillary: 174 mg/dL — ABNORMAL HIGH (ref 65–99)

## 2017-04-02 LAB — PHOSPHORUS
Phosphorus: 7.2 mg/dL — ABNORMAL HIGH (ref 2.5–4.6)
Phosphorus: 7.1 mg/dL — ABNORMAL HIGH (ref 2.5–4.6)

## 2017-04-02 LAB — TROPONIN I
TROPONIN I: 0.05 ng/mL — AB (ref <0.03)
Troponin I: 0.2 ng/mL (ref <0.03)
Troponin I: 5.08 ng/mL (ref <0.03)

## 2017-04-02 LAB — HIV ANTIBODY (ROUTINE TESTING W REFLEX): HIV SCREEN 4TH GENERATION: NONREACTIVE

## 2017-04-02 LAB — HEMOGLOBIN A1C
HEMOGLOBIN A1C: 6.5 % — AB (ref 4.8–5.6)
MEAN PLASMA GLUCOSE: 139.85 mg/dL

## 2017-04-02 LAB — LACTIC ACID, PLASMA: LACTIC ACID, VENOUS: 13.9 mmol/L — AB (ref 0.5–1.9)

## 2017-04-02 LAB — MAGNESIUM
MAGNESIUM: 2.5 mg/dL — AB (ref 1.7–2.4)
Magnesium: 2.9 mg/dL — ABNORMAL HIGH (ref 1.7–2.4)

## 2017-04-02 LAB — HEPATITIS B SURFACE ANTIGEN: HEP B S AG: NEGATIVE

## 2017-04-02 LAB — APTT: APTT: 38 s — AB (ref 24–36)

## 2017-04-02 LAB — POTASSIUM: Potassium: 4.7 mmol/L (ref 3.5–5.1)

## 2017-04-02 LAB — ABO/RH: ABO/RH(D): B POS

## 2017-04-02 SURGERY — OPEN REDUCTION INTERNAL FIXATION (ORIF) ANKLE FRACTURE
Anesthesia: General | Laterality: Right

## 2017-04-02 MED ORDER — ORAL CARE MOUTH RINSE: 15.0000 mL | Freq: Four times a day (QID) | OROMUCOSAL | Status: DC

## 2017-04-02 MED ORDER — SODIUM CHLORIDE 0.9 % IV SOLN
25.0000 ug/h | INTRAVENOUS | Status: DC
  Filled 2017-04-02: qty 50

## 2017-04-02 MED ORDER — SUGAMMADEX SODIUM 200 MG/2ML IV SOLN
INTRAVENOUS | Status: AC
  Filled 2017-04-02: qty 2

## 2017-04-02 MED ORDER — ROCURONIUM BROMIDE 50 MG/5ML IV SOLN
INTRAVENOUS | Status: AC
  Filled 2017-04-02: qty 1

## 2017-04-02 MED ORDER — INSULIN ASPART 100 UNIT/ML ~~LOC~~ SOLN: 0.0000 [IU] | SUBCUTANEOUS | Status: DC

## 2017-04-02 MED ORDER — LIDOCAINE HCL (CARDIAC) 20 MG/ML IV SOLN
INTRAVENOUS | Status: DC | PRN
  Administered 2017-04-02: 100 mg via INTRAVENOUS

## 2017-04-02 MED ORDER — SUGAMMADEX SODIUM 200 MG/2ML IV SOLN
INTRAVENOUS | Status: DC | PRN
  Administered 2017-04-02: 123.4 mg via INTRAVENOUS

## 2017-04-02 MED ORDER — FENTANYL BOLUS VIA INFUSION
50.0000 ug | INTRAVENOUS | Status: DC | PRN
  Filled 2017-04-02: qty 50

## 2017-04-02 MED ORDER — SENNOSIDES 8.8 MG/5ML PO SYRP
5.0000 mL | ORAL_SOLUTION | Freq: Two times a day (BID) | ORAL | Status: DC | PRN
  Filled 2017-04-02: qty 5

## 2017-04-02 MED ORDER — ETOMIDATE 2 MG/ML IV SOLN
INTRAVENOUS | Status: DC | PRN
  Administered 2017-04-02: 8 mg via INTRAVENOUS

## 2017-04-02 MED ORDER — ONDANSETRON HCL 4 MG/2ML IJ SOLN
INTRAMUSCULAR | Status: DC | PRN
  Administered 2017-04-02: 4 mg via INTRAVENOUS

## 2017-04-02 MED ORDER — MIDAZOLAM HCL 2 MG/2ML IJ SOLN
INTRAMUSCULAR | Status: DC | PRN
  Administered 2017-04-02 (×2): 1 mg via INTRAVENOUS

## 2017-04-02 MED ORDER — PANTOPRAZOLE SODIUM 40 MG IV SOLR: 40.0000 mg | Freq: Every day | INTRAVENOUS | Status: DC

## 2017-04-02 MED ORDER — CEFAZOLIN SODIUM-DEXTROSE 2-3 GM-%(50ML) IV SOLR
INTRAVENOUS | Status: DC | PRN
Start: 2017-04-02 — End: 2017-04-02
  Administered 2017-04-02: 2 g via INTRAVENOUS

## 2017-04-02 MED ORDER — NEOMYCIN-POLYMYXIN B GU 40-200000 IR SOLN
Status: DC | PRN
  Administered 2017-04-02: 4 mL

## 2017-04-02 MED ORDER — MIDAZOLAM HCL 2 MG/2ML IJ SOLN
INTRAMUSCULAR | Status: AC
  Filled 2017-04-02: qty 2

## 2017-04-02 MED ORDER — FENTANYL CITRATE (PF) 100 MCG/2ML IJ SOLN: 50.0000 ug | Freq: Once | INTRAMUSCULAR | Status: DC

## 2017-04-02 MED ORDER — FENTANYL 2500MCG IN NS 250ML (10MCG/ML) PREMIX INFUSION: 25.0000 ug/h | INTRAVENOUS | Status: DC

## 2017-04-02 MED ORDER — ALLOPURINOL 100 MG PO TABS
100.0000 mg | ORAL_TABLET | Freq: Every day | ORAL | Status: DC
  Filled 2017-04-02: qty 1

## 2017-04-02 MED ORDER — DEXAMETHASONE SODIUM PHOSPHATE 10 MG/ML IJ SOLN
INTRAMUSCULAR | Status: AC
  Filled 2017-04-02: qty 1

## 2017-04-02 MED ORDER — SODIUM CHLORIDE 0.9 % IV SOLN
0.0300 [IU]/min | INTRAVENOUS | Status: DC
  Administered 2017-04-02: 0.03 [IU]/min via INTRAVENOUS
  Filled 2017-04-02: qty 2

## 2017-04-02 MED ORDER — ONDANSETRON HCL 4 MG/2ML IJ SOLN
INTRAMUSCULAR | Status: AC
  Filled 2017-04-02: qty 2

## 2017-04-02 MED ORDER — DEXAMETHASONE SODIUM PHOSPHATE 10 MG/ML IJ SOLN
INTRAMUSCULAR | Status: DC | PRN
  Administered 2017-04-02: 10 mg via INTRAVENOUS

## 2017-04-02 MED ORDER — DOCUSATE SODIUM 50 MG/5ML PO LIQD: 100.0000 mg | Freq: Every day | ORAL | Status: DC

## 2017-04-02 MED ORDER — BUPIVACAINE HCL 0.25 % IJ SOLN
INTRAMUSCULAR | Status: DC | PRN
  Administered 2017-04-02: 30 mL

## 2017-04-02 MED ORDER — FENTANYL 2500MCG IN NS 250ML (10MCG/ML) PREMIX INFUSION
INTRAVENOUS | Status: AC
  Administered 2017-04-02: 50 ug/h
  Filled 2017-04-02: qty 250

## 2017-04-02 MED ORDER — CHLORHEXIDINE GLUCONATE 0.12% ORAL RINSE (MEDLINE KIT)
15.0000 mL | Freq: Two times a day (BID) | OROMUCOSAL | Status: DC
  Administered 2017-04-02: 15 mL via OROMUCOSAL

## 2017-04-02 MED ORDER — FENTANYL CITRATE (PF) 100 MCG/2ML IJ SOLN
INTRAMUSCULAR | Status: AC
  Filled 2017-04-02: qty 2

## 2017-04-02 MED ORDER — PHENYLEPHRINE HCL 10 MG/ML IJ SOLN
0.0000 ug/min | INTRAMUSCULAR | Status: DC
  Administered 2017-04-02: 20 ug/min via INTRAVENOUS
  Filled 2017-04-02: qty 40

## 2017-04-02 MED ORDER — HYDROCORTISONE NA SUCCINATE PF 100 MG IJ SOLR: 100.0000 mg | Freq: Three times a day (TID) | INTRAMUSCULAR | Status: DC

## 2017-04-02 MED ORDER — VASOPRESSIN 20 UNIT/ML IV SOLN
0.0300 [IU]/min | INTRAVENOUS | Status: DC
  Filled 2017-04-02: qty 2

## 2017-04-02 MED ORDER — FENTANYL CITRATE (PF) 100 MCG/2ML IJ SOLN
INTRAMUSCULAR | Status: DC | PRN
  Administered 2017-04-02 (×2): 50 ug via INTRAVENOUS

## 2017-04-02 MED ORDER — DEXTROSE 5 % IV SOLN
0.0000 ug/min | INTRAVENOUS | Status: DC
  Administered 2017-04-02: 50 ug/min via INTRAVENOUS
  Administered 2017-04-02: 65 ug/min via INTRAVENOUS
  Administered 2017-04-02: 16 ug/min via INTRAVENOUS
  Filled 2017-04-02 (×3): qty 4

## 2017-04-02 MED ORDER — DEXTROSE 50 % IV SOLN
50.0000 mL | Freq: Once | INTRAVENOUS | Status: AC
  Administered 2017-04-02 (×2): 50 mL via INTRAVENOUS

## 2017-04-02 MED ORDER — STERILE WATER FOR INJECTION IV SOLN
INTRAVENOUS | Status: DC
  Administered 2017-04-02: 18:00:00 via INTRAVENOUS
  Filled 2017-04-02 (×3): qty 850

## 2017-04-02 MED ORDER — DEXTROSE 50 % IV SOLN: 50.0000 mL | Freq: Once | INTRAVENOUS | Status: DC

## 2017-04-02 MED ORDER — FLUCONAZOLE 100 MG PO TABS
100.0000 mg | ORAL_TABLET | ORAL | Status: DC
  Filled 2017-04-02: qty 1

## 2017-04-02 MED ORDER — SODIUM CHLORIDE 0.9 % IV SOLN
Freq: Once | INTRAVENOUS | Status: AC
  Administered 2017-04-02: 20:00:00 via INTRAVENOUS

## 2017-04-02 MED ORDER — INSULIN DETEMIR 100 UNIT/ML ~~LOC~~ SOLN
2.0000 [IU] | Freq: Every day | SUBCUTANEOUS | Status: DC
  Filled 2017-04-02: qty 0.02

## 2017-04-02 MED ORDER — ROCURONIUM BROMIDE 100 MG/10ML IV SOLN
INTRAVENOUS | Status: DC | PRN
  Administered 2017-04-02: 30 mg via INTRAVENOUS

## 2017-04-02 MED ORDER — BISACODYL 10 MG RE SUPP: 10.0000 mg | Freq: Every day | RECTAL | Status: DC | PRN

## 2017-04-02 MED ORDER — EPINEPHRINE PF 1 MG/10ML IJ SOSY
PREFILLED_SYRINGE | INTRAMUSCULAR | Status: AC
  Filled 2017-04-02: qty 10

## 2017-04-02 MED ORDER — SODIUM CHLORIDE 0.9 % IV SOLN
INTRAVENOUS | Status: DC | PRN
  Administered 2017-04-02: 20 ug/min via INTRAVENOUS

## 2017-04-02 MED ORDER — DEXTROSE 50 % IV SOLN
INTRAVENOUS | Status: AC
  Filled 2017-04-02: qty 50

## 2017-04-02 SURGICAL SUPPLY — 55 items
BANDAGE ELASTIC 4 LF NS (GAUZE/BANDAGES/DRESSINGS) ×6 IMPLANT
BIT DRILL 2.5X110 QC LCP DISP (BIT) ×6 IMPLANT
BIT DRILL CANN 2.7X625 NONSTRL (BIT) ×3 IMPLANT
BLADE SURG 15 STRL LF DISP TIS (BLADE) ×1 IMPLANT
BLADE SURG 15 STRL SS (BLADE) ×2
BNDG ESMARK 6X12 TAN STRL LF (GAUZE/BANDAGES/DRESSINGS) ×3 IMPLANT
CLOSURE WOUND 1/2 X4 (GAUZE/BANDAGES/DRESSINGS) ×2
CUFF TOURN 24 STER (MISCELLANEOUS) ×3 IMPLANT
CUFF TOURN 30 STER DUAL PORT (MISCELLANEOUS) IMPLANT
DRAPE FLUOR MINI C-ARM 54X84 (DRAPES) ×3 IMPLANT
DRAPE INCISE IOBAN 66X45 STRL (DRAPES) ×3 IMPLANT
DRAPE U-SHAPE 47X51 STRL (DRAPES) ×3 IMPLANT
DURAPREP 26ML APPLICATOR (WOUND CARE) ×6 IMPLANT
ELECT REM PT RETURN 9FT ADLT (ELECTROSURGICAL) ×3
ELECTRODE REM PT RTRN 9FT ADLT (ELECTROSURGICAL) ×1 IMPLANT
GAUZE PETRO XEROFOAM 1X8 (MISCELLANEOUS) ×3 IMPLANT
GAUZE SPONGE 4X4 12PLY STRL (GAUZE/BANDAGES/DRESSINGS) ×3 IMPLANT
GLOVE BIOGEL PI IND STRL 9 (GLOVE) ×1 IMPLANT
GLOVE BIOGEL PI INDICATOR 9 (GLOVE) ×2
GLOVE SURG 9.0 ORTHO LTXF (GLOVE) ×6 IMPLANT
GOWN STRL REUS TWL 2XL XL LVL4 (GOWN DISPOSABLE) ×3 IMPLANT
GOWN STRL REUS W/ TWL LRG LVL3 (GOWN DISPOSABLE) ×1 IMPLANT
GOWN STRL REUS W/TWL LRG LVL3 (GOWN DISPOSABLE) ×2
GUIDEWIRE THREADED 150MM (WIRE) ×6 IMPLANT
KIT RM TURNOVER STRD PROC AR (KITS) ×3 IMPLANT
LABEL OR SOLS (LABEL) ×3 IMPLANT
NS IRRIG 1000ML POUR BTL (IV SOLUTION) ×3 IMPLANT
PACK EXTREMITY ARMC (MISCELLANEOUS) ×3 IMPLANT
PAD ABD DERMACEA PRESS 5X9 (GAUZE/BANDAGES/DRESSINGS) ×6 IMPLANT
PAD CAST CTTN 4X4 STRL (SOFTGOODS) ×3 IMPLANT
PADDING CAST 4IN STRL (MISCELLANEOUS) ×4
PADDING CAST BLEND 4X4 STRL (MISCELLANEOUS) ×2 IMPLANT
PADDING CAST COTTON 4X4 STRL (SOFTGOODS) ×6
PROS LCP PLATE 6H 85MM (Plate) ×3 IMPLANT
PROSTHESIS LCP PLATE 6H 85MM (Plate) ×1 IMPLANT
SCREW CANN L THRD/30 4.0 (Screw) ×3 IMPLANT
SCREW CANN L THRD/32 4.0 (Orthopedic Implant) ×3 IMPLANT
SCREW CANN L THRD/40 4.0 (Screw) ×3 IMPLANT
SCREW CANN L THRD/42 4.0 (Screw) ×3 IMPLANT
SCREW CORTEX 3.5 14MM (Screw) ×6 IMPLANT
SCREW LOCK CORT ST 3.5X14 (Screw) ×3 IMPLANT
SCREW LOCK T15 FT 20X3.5XST (Screw) ×1 IMPLANT
SCREW LOCKING 3.5X20 (Screw) ×2 IMPLANT
SPLINT CAST 1 STEP 4X30 (MISCELLANEOUS) ×6 IMPLANT
SPONGE LAP 18X18 5 PK (GAUZE/BANDAGES/DRESSINGS) ×3 IMPLANT
STAPLER SKIN PROX 35W (STAPLE) ×3 IMPLANT
STOCKINETTE STRL 6IN 960660 (GAUZE/BANDAGES/DRESSINGS) ×3 IMPLANT
STRIP CLOSURE SKIN 1/2X4 (GAUZE/BANDAGES/DRESSINGS) ×4 IMPLANT
SUT ETHILON 3-0 FS-10 30 BLK (SUTURE) ×6
SUT VIC AB 2-0 SH 27 (SUTURE) ×4
SUT VIC AB 2-0 SH 27XBRD (SUTURE) ×2 IMPLANT
SUTURE EHLN 3-0 FS-10 30 BLK (SUTURE) ×2 IMPLANT
SYR 30ML LL (SYRINGE) ×3 IMPLANT
TAPE MICROPORE 2IN (TAPE) ×3 IMPLANT
WASHER 7MM DIA (Washer) ×6 IMPLANT

## 2017-04-02 NOTE — Transfer of Care (Signed)
Immediate Anesthesia Transfer of Care Note  Patient: Rossie MuskratLaura Dalia  Procedure(s) Performed: OPEN REDUCTION INTERNAL FIXATION (ORIF) ANKLE FRACTURE (Right )  Patient Location: PACU  Anesthesia Type:General  Level of Consciousness: sedated  Airway & Oxygen Therapy: Patient Spontanous Breathing and Patient connected to face mask oxygen  Post-op Assessment: Report given to RN and Post -op Vital signs reviewed and unstable, Anesthesiologist notified  Post vital signs: unstable  Last Vitals:  Vitals:   04/09/2017 1729 04/11/2017 1730  BP:  (!) 41/31  Pulse: 65   Resp: 11 15  Temp:    SpO2: 91%     Last Pain:  Vitals:   04/05/2017 1350  TempSrc: Tympanic  PainSc: 7          Complications: Patient re-intubated and code initiated

## 2017-04-02 NOTE — Progress Notes (Signed)
ETT retracted 2cm to 23 per Radiology & Annabelle Harmanana NP. Pt tol well, pt appears to be resting comfortably, will continue to monitor

## 2017-04-02 NOTE — Progress Notes (Signed)
Subjective:  Patient reports right leg pain as mild to moderate.  Patient cleared by cardiology and medicine for surgery.  Objective:   VITALS:   Vitals:   03/20/2017 0318 03/27/2017 1027 04/16/2017 1329 03/18/2017 1350  BP: 126/77 125/67 134/70 (!) 151/80  Pulse: 86 77 71 72  Resp: 19  16 17   Temp: 99.8 F (37.7 C) 99.5 F (37.5 C) 99.2 F (37.3 C) 98.1 F (36.7 C)  TempSrc: Oral Oral Oral Tympanic  SpO2: 94% 100% 96% 94%  Weight:    61.7 kg (136 lb)  Height:    5\' 4"  (1.626 m)    PHYSICAL EXAM: Right lower extremity: Splint in place.  Foot perfused.   Intact sensation to light touch.   LABS  Results for orders placed or performed during the hospital encounter of 04/15/2017 (from the past 24 hour(s))  HIV antibody (Routine Testing)     Status: None   Collection Time: Apr 15, 2017  3:44 PM  Result Value Ref Range   HIV Screen 4th Generation wRfx Non Reactive Non Reactive  Protime-INR     Status: None   Collection Time: 04-15-17  3:44 PM  Result Value Ref Range   Prothrombin Time 13.9 11.4 - 15.2 seconds   INR 1.08   APTT     Status: None   Collection Time: 04-15-17  3:44 PM  Result Value Ref Range   aPTT 34 24 - 36 seconds  Type and screen If not already done in ED     Status: None   Collection Time: 04/15/2017  3:44 PM  Result Value Ref Range   ABO/RH(D) B POS    Antibody Screen NEG    Sample Expiration      04/04/2017 Performed at [facility], [address] [place], [place] [address]   Hepatitis B surface antigen     Status: None   Collection Time: 04-15-2017  3:44 PM  Result Value Ref Range   Hepatitis B Surface Ag Negative Negative  Troponin I     Status: Abnormal   Collection Time: 04-15-2017  6:15 PM  Result Value Ref Range   Troponin I 0.06 (HH) <0.03 ng/mL  Protime-INR     Status: None   Collection Time: 2017-04-15  6:15 PM  Result Value Ref Range   Prothrombin Time 13.4 11.4 - 15.2 seconds   INR 1.03   APTT     Status: None   Collection Time:  2017-04-15  6:15 PM  Result Value Ref Range   aPTT 33 24 - 36 seconds  Basic metabolic panel     Status: Abnormal   Collection Time: 2017/04/15  6:15 PM  Result Value Ref Range   Sodium 140 135 - 145 mmol/L   Potassium 5.3 (H) 3.5 - 5.1 mmol/L   Chloride 97 (L) 101 - 111 mmol/L   CO2 26 22 - 32 mmol/L   Glucose, Bld 198 (H) 65 - 99 mg/dL   BUN 53 (H) 6 - 20 mg/dL   Creatinine, Ser 6.96 (H) 0.44 - 1.00 mg/dL   Calcium 9.2 8.9 - 29.5 mg/dL   GFR calc non Af Amer 5 (L) >60 mL/min   GFR calc Af Amer 6 (L) >60 mL/min   Anion gap 17 (H) 5 - 15  Phosphorus     Status: Abnormal   Collection Time: 04-15-2017  6:15 PM  Result Value Ref Range   Phosphorus 8.4 (H) 2.5 - 4.6 mg/dL  Surgical PCR screen     Status:  None   Collection Time: 04/12/2017  7:21 PM  Result Value Ref Range   MRSA, PCR NEGATIVE NEGATIVE   Staphylococcus aureus NEGATIVE NEGATIVE  Glucose, capillary     Status: Abnormal   Collection Time: 03/21/2017  9:17 PM  Result Value Ref Range   Glucose-Capillary 212 (H) 65 - 99 mg/dL  Troponin I     Status: Abnormal   Collection Time: 03/23/2017 12:34 AM  Result Value Ref Range   Troponin I 0.05 (HH) <0.03 ng/mL  Glucose, capillary     Status: Abnormal   Collection Time: 04/13/2017  2:02 AM  Result Value Ref Range   Glucose-Capillary 154 (H) 65 - 99 mg/dL  CBC     Status: Abnormal   Collection Time: 03/22/2017  4:57 AM  Result Value Ref Range   WBC 9.3 3.6 - 11.0 K/uL   RBC 3.65 (L) 3.80 - 5.20 MIL/uL   Hemoglobin 11.4 (L) 12.0 - 16.0 g/dL   HCT 16.1 09.6 - 04.5 %   MCV 97.1 80.0 - 100.0 fL   MCH 31.2 26.0 - 34.0 pg   MCHC 32.2 32.0 - 36.0 g/dL   RDW 40.9 (H) 81.1 - 91.4 %   Platelets 204 150 - 440 K/uL  Potassium     Status: None   Collection Time: 03/26/2017  4:58 AM  Result Value Ref Range   Potassium 4.7 3.5 - 5.1 mmol/L  hCG, quantitative, pregnancy     Status: Abnormal   Collection Time: 03/27/2017  4:58 AM  Result Value Ref Range   hCG, Beta Chain, Quant, S 6 (H) <5 mIU/mL   Glucose, capillary     Status: Abnormal   Collection Time: 04/01/2017  7:48 AM  Result Value Ref Range   Glucose-Capillary 152 (H) 65 - 99 mg/dL  Glucose, capillary     Status: None   Collection Time: 03/29/2017 10:25 AM  Result Value Ref Range   Glucose-Capillary 73 65 - 99 mg/dL  Protime-INR     Status: None   Collection Time: 03/17/2017 11:16 AM  Result Value Ref Range   Prothrombin Time 14.2 11.4 - 15.2 seconds   INR 1.11   Hemoglobin A1c     Status: Abnormal   Collection Time: 03/24/2017 11:16 AM  Result Value Ref Range   Hgb A1c MFr Bld 6.5 (H) 4.8 - 5.6 %   Mean Plasma Glucose 139.85 mg/dL  Glucose, capillary     Status: Abnormal   Collection Time: 03/19/2017 11:21 AM  Result Value Ref Range   Glucose-Capillary 19 (LL) 65 - 99 mg/dL   Comment 1 Notify RN   Glucose, capillary     Status: Abnormal   Collection Time: 04/01/2017 12:07 PM  Result Value Ref Range   Glucose-Capillary 106 (H) 65 - 99 mg/dL  Glucose, capillary     Status: Abnormal   Collection Time: 03/26/2017  1:28 PM  Result Value Ref Range   Glucose-Capillary 52 (L) 65 - 99 mg/dL    Dg Ankle Complete Right  Result Date: 04/08/2017 CLINICAL DATA:  Fall on ramp.  Right ankle pain. EXAM: RIGHT ANKLE - COMPLETE 3+ VIEW COMPARISON:  None. FINDINGS: Displaced comminuted fractures are present in the distal fibula and at the medial malleolus. The talus is displaced laterally. The fractures are displaced 1 shaft width. A posterior tibial fractures present as well. Moderate osteopenia is noted. Vascular calcifications are present. IMPRESSION: 1. Comminuted trimalleolar fracture with lateral displacement of the talus. 2. Distal fibular fracture is displaced 1  shaft width. 3. Moderate osteopenia. 4. Microvascular calcifications compatible with known diagnosis of diabetes. Electronically Signed   By: Marin Robertshristopher  Mattern M.D.   On: 2017/06/03 11:01   Dg Chest Portable 1 View  Result Date: 04-05-17 CLINICAL DATA:  Preop. EXAM:  PORTABLE CHEST 1 VIEW COMPARISON:  None. FINDINGS: Midline trachea. Mild cardiomegaly. Atherosclerosis in the transverse aorta. No pleural effusion or pneumothorax. A right axillary vascular stent. No congestive failure. Clear lungs. IMPRESSION: Cardiomegaly without congestive failure. Aortic Atherosclerosis (ICD10-I70.0).  This is age advanced. Electronically Signed   By: Jeronimo GreavesKyle  Talbot M.D.   On: 2017/06/03 11:48   Dg Ankle Right Port  Result Date: 04-05-17 CLINICAL DATA:  Post reduction in plaster images EXAM: PORTABLE RIGHT ANKLE - 2 VIEW COMPARISON:  Pre reduction ankle series of today's date FINDINGS: The patient has undergone close reduction of the comminuted trimalleolar fracture. There remains some disruption of the ankle joint mortise with lateral migration of the talar dome with respect to the tibial plafond. The distal fibular fracture has been partially reduced as has the medial malleolar fracture. Involving the distal IMPRESSION: Partial reduction of the trimalleolar fracture of the right ankle with persistent disruption of the ankle joint mortise. Electronically Signed   By: David  SwazilandJordan M.D.   On: 2017/06/03 13:24    Assessment/Plan: Day of Surgery   Active Problems:   Closed right ankle fracture  Plan for ORIF of right trimalleolar ankle fracture today.  Patient is NPO.  I have reviewed the labs and radiographs in preparation for surgery.    Juanell FairlyKRASINSKI, Liese Dizdarevic , MD 04/05/2017, 2:00 PM

## 2017-04-02 NOTE — Progress Notes (Signed)
Inpatient Diabetes Program Recommendations  AACE/ADA: New Consensus Statement on Inpatient Glycemic Control (2015)  Target Ranges:  Prepandial:   less than 140 mg/dL      Peak postprandial:   less than 180 mg/dL (1-2 hours)      Critically ill patients:  140 - 180 mg/dL   Lab Results  Component Value Date   GLUCAP 73 04/09/2017    Review of Glycemic Control Results for Sarah MuskratRICHMOND, Sarah Schroeder (MRN [id number]) as of 04/14/2017 11:10  Ref. Range 09/27/2014 11:38 03/29/2017 21:17 04/16/2017 02:02 04/11/2017 07:48 04/14/2017 10:25  Glucose-Capillary Latest Ref Range: 65 - 99 mg/dL 782154 (H) 956212 (H) 213154 (H) 152 (H) 73    Diabetes history: DM2 hx (per Endocrinologist note patient is treated as DM1)  Outpatient Diabetes medications: Lantus 3 units QHS, Humalog 1-2 units TID with meals  Current orders for Inpatient glycemic control: Novolog 0-9 units TID with meals  Inpatient Diabetes Program Recommendations:  Insulin - Basal: Please consider ordering Levemir 2 units Q24H.  Correction (SSI): Please consider decreasing Novolog correction scale to Novolog 0-5 units custom scale ACHS or Q4H if patient will remain NPO.  -Custom Novolog correction scale 0-5 units ACHS (or Q4H if pt will remain NPO)      151-200  1 unit      201-250  2 units      251-300  3 units      301-350  4 units      351-400  5 units A1C: Please consider ordering an A1C to evaluate glycemic control over the past 2-3 months.  NOTE: Noted consult for Diabetes Coordinator. Patient is currently in ER. Chart reviewed. [facility] (Dr. Everette RankJelesoff) and was last seen on 02/19/17 at which time patient was instructed to take Lantus 2 units daily, Humalog 1 unit with breakfast, 2 units with lunch and supper. According to office notes by Dr. Everette RankJelesoff, patient is managed as DM1.  Patient is very sensitive to insulin. Would recommend ordering very low dose basal insulin daily and using custom scale as noted above and  ordering an A1C.   Will continue to follow and make further recommendations as more data is collected.  Text paged Dr. Hilton SinclairWeiting at  11:14am  Susette RacerJulie Donyel Nester, RN, BA, MHA, CDE Diabetes Coordinator Inpatient Diabetes Program  [phone] (Team Pager) [phone] [facility] Office) 03/20/2017 11:11 AM

## 2017-04-02 NOTE — Progress Notes (Signed)
Rept to CBS CorporationDeborah RN in short stay. Rept given including elevated K 5.3 yesterday. Pt had dialysis yesterday and had some changes on admit EKG. Gavin Poundeborah RN to speak with anesthesiologist prior to sending for patient. Gavin PoundDeborah RN will get back to this nurse. Will continue to monitor.

## 2017-04-02 NOTE — Consult Note (Signed)
Name: Sarah MuskratLaura Schroeder MRN: [id number] DOB: 18-Jan-1971     CONSULTATION DATE: 03/19/2017    CHIEF COMPLAINT: acute cardiac arrest   HISTORY OF PRESENT ILLNESS:   47 yo AAF with multiple medical issues S/p ankel fracture s/p ORIF-->subsequnetly extubated, then developed acute bradycardia and sudden cardiac arrest CPR/ACLS for approx 30 mins, patient cardiac arrested 5 times.  Patient with acute MI, STEMI inferior leads Patient family updated at bedside  Patient at high risk for cardiac arrest and death  This is unfavorable outcome with recurrent cardiac arrest-patient is NOT a candidate for hypothermia protocol  PAST MEDICAL HISTORY :   has a past medical history of Arrhythmia, Asthma, CAD (coronary artery disease), CHF (congestive heart failure) (HCC), Chronic kidney disease, Dialysis patient (HCC), DM (diabetes mellitus), type 2 (HCC), DVT (deep venous thrombosis) (HCC), Elevated lipids, GERD (gastroesophageal reflux disease), Gout, Hypertension, Osteomyelitis (HCC), PVD (peripheral vascular disease) (HCC), and Wears dentures.  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 Prior to Admission medications   Medication Sig Start Date End Date Taking? Authorizing Provider  allopurinol (ZYLOPRIM) 100 MG tablet Take 100 mg by mouth daily.   Yes [provider]  amLODipine (NORVASC) 10 MG tablet Take 10 mg by mouth daily.   Yes [provider]  aspirin 81 MG tablet Take 81 mg by mouth daily.   Yes [provider]  calcium acetate, Phos Binder, (PHOSLYRA) 667 MG/5ML SOLN Take  667 mg by mouth 3 (three) times daily with meals.   Yes [provider]  esomeprazole (NEXIUM) 20 MG capsule Take 20 mg by mouth daily at 12 noon.   Yes [provider]  fluconazole (DIFLUCAN) 100 MG tablet Take 100 mg by mouth 3 (three) times a week. Mon. Wed. Friday   Yes [provider]  gabapentin (NEURONTIN) 100 MG capsule Take 100 mg by mouth 2 (two) times daily. Alternate with lyrica every month    Yes [provider]  insulin glargine (LANTUS) 100 UNIT/ML injection Inject 3 Units into the skin at bedtime.   Yes [provider]  insulin lispro (HUMALOG) 100 UNIT/ML injection Inject 1-2 Units into the skin 3 (three) times daily before meals.    Yes [provider]  losartan (COZAAR) 50 MG tablet Take 50 mg by mouth daily.   Yes [provider]  metoCLOPramide (REGLAN) 10 MG tablet Take 10 mg by mouth daily.   Yes [provider]  metoprolol tartrate (LOPRESSOR) 50 MG tablet Take 50 mg by mouth 2 (two) times daily.   Yes [provider]  ondansetron (ZOFRAN) 4 MG tablet Take 4 mg by mouth every 8 (eight) hours as needed for nausea or vomiting.   Yes [provider]  potassium chloride (K-DUR) 10 MEQ tablet Take 10 mEq by mouth daily.   Yes [provider]  albuterol (PROVENTIL HFA;VENTOLIN HFA) 108 (90 BASE) MCG/ACT inhaler Inhale 2 puffs into the lungs every 4 (four) hours as needed for wheezing or shortness of breath.    [provider]  gentamicin cream (GARAMYCIN) 0.1 % Apply 1 application topically daily. Mon, Wed, Fri    [provider]  HYDROcodone-acetaminophen (NORCO) 5-325 MG per tablet Take 1 tablet by mouth every 6 (six)  hours as needed for moderate pain. Patient not taking: Reported on 04/05/2017 09/27/14   Annice Needy, MD  megestrol (MEGACE) 40 MG tablet Take 40 mg by mouth daily as needed.    [provider]  promethazine (PHENERGAN) 25 MG tablet Take 25 mg  by mouth every 6 (six) hours as needed for nausea or vomiting.    [provider]  zolpidem (AMBIEN) 5 MG tablet Take 5 mg by mouth at bedtime as needed for sleep.    [provider]   Allergies  Allergen Reactions  . Lisinopril Cough   SOCIAL HISTORY:  reports that  has never smoked. she has never used smokeless tobacco. She reports that she does not drink alcohol or use drugs.  REVIEW OF SYSTEMS:   ALL OTHER ROS ARE NEGATIVE    VITAL SIGNS: Temp:  [98.1 F (36.7 C)-99.8 F (37.7 C)] 98.1 F (36.7 C) (01/17 1350) Pulse Rate:  [71-146] 146 (01/17 1652) Resp:  [0-24] 0 (01/17 1630) BP: (40-151)/(30-92) 147/92 (01/17 1645) SpO2:  [77 %-100 %] 88 % (01/17 1652) Weight:  [136 lb (61.7 kg)-136 lb 14.5 oz (62.1 kg)] 136 lb (61.7 kg) (01/17 1350)  Physical Examination:  GENERAL:critically ill appearing, +resp distress HEAD: Normocephalic, atraumatic.  EYES: Pupils equal, round, reactive to light.  No scleral icterus.  MOUTH: Moist mucosal membrane. NECK: Supple. No thyromegaly. No nodules. No JVD.  PULMONARY: +rhonchi, +wheezing CARDIOVASCULAR: S1 and S2. Regular rate and rhythm. No murmurs, rubs, or gallops.  GASTROINTESTINAL: Soft, nontender, -distended. No masses. Positive bowel sounds. No hepatosplenomegaly.  MUSCULOSKELETAL: No swelling, clubbing, or edema.  NEUROLOGIC: obtunded SKIN:intact,warm,dry       Recent Labs  Lab 03/31/2017 1224 03/26/2017 1815 03/26/2017 0458  NA 138 140  --   K 5.9* 5.3* 4.7  CL 96* 97*  --   CO2 26 26  --   BUN 53* 53*  --   CREATININE 8.11* 8.62*  --   GLUCOSE 114* 198*  --    Recent Labs  Lab 04/06/2017 1105 03/30/2017 0457  HGB 12.9 11.4*  HCT 40.7 35.4  WBC 7.0 9.3  PLT 205 204    ASSESSMENT / PLAN: 47 yo AAF with acute cardiac arrest and acute cardiogenic shock with acute pulmonary edema from acute STEMI Patient with multiorgan failure  Patient prognosis is very poor, patient at high risk for cardiac arrest   And death.  1.continue vent support 2.vasopressor support  Family at bedside, patient will likely not survive tonight.    Critical Care Time devoted to patient care services described in this note is 65 minutes.   Overall, patient is critically ill, prognosis is guarded.  Patient with Multiorgan failure and at high risk for cardiac arrest and death.    Lucie Leather, M.D.  [facility]  Medical Director [facility] Medical Director [facility] Cardio-Pulmonary Department

## 2017-04-02 NOTE — Care Management (Signed)
Patient from [place]. Dialysis patient. Marchelle FolksAmanda with [facility] notified of admission and will follow patient.

## 2017-04-02 NOTE — Progress Notes (Signed)
Spoke with Sarah Magiceborah RN in short stay. Anesthesiology is calling a stat consult for cardiac clearance. Dr. Gwen PoundsKowalski aware of consult. Pt aware of consult and plan of care. Family present. Will continue to monitor.

## 2017-04-02 NOTE — Anesthesia Preprocedure Evaluation (Addendum)
Anesthesia Evaluation  Patient identified by MRN, date of birth, ID band Patient awake    Reviewed: Allergy & Precautions, NPO status , Patient's Chart, lab work & pertinent test results, reviewed documented beta blocker date and time   Airway Mallampati: II  TM Distance: >3 FB     Dental  (+) Chipped   Pulmonary asthma ,    Pulmonary exam normal        Cardiovascular hypertension, + CAD, + Peripheral Vascular Disease and +CHF  Normal cardiovascular exam+ dysrhythmias      Neuro/Psych negative neurological ROS  negative psych ROS   GI/Hepatic Neg liver ROS, GERD  Medicated and Controlled,  Endo/Other  diabetes  Renal/GU CRF and ESRFRenal disease     Musculoskeletal   Abdominal Normal abdominal exam  (+)   Peds  Hematology   Anesthesia Other Findings Past Medical History: No date: Arrhythmia No date: Asthma No date: CAD (coronary artery disease) No date: CHF (congestive heart failure) (HCC) No date: Chronic kidney disease No date: Dialysis patient [facility](HCC)     Comment:  Mon, Wed, Fri No date: DM (diabetes mellitus), type 2 (HCC) No date: DVT (deep venous thrombosis) (HCC)     Comment:  right arm No date: Elevated lipids No date: GERD (gastroesophageal reflux disease) No date: Gout No date: Hypertension No date: Osteomyelitis (HCC)     Comment:  chronic No date: PVD (peripheral vascular disease) (HCC) No date: Wears dentures     Comment:  full upper and lower  Reproductive/Obstetrics                            Anesthesia Physical  Anesthesia Plan  ASA: III  Anesthesia Plan: General   Post-op Pain Management:    Induction: Intravenous  PONV Risk Score and Plan:   Airway Management Planned: Oral ETT  Additional Equipment:   Intra-op Plan:   Post-operative Plan:   Informed Consent: I have reviewed the patients History and Physical, chart, labs and discussed the procedure  including the risks, benefits and alternatives for the proposed anesthesia with the patient or authorized representative who has indicated his/her understanding and acceptance.     Plan Discussed with: CRNA and Surgeon  Anesthesia Plan Comments:        Anesthesia Quick Evaluation

## 2017-04-02 NOTE — Progress Notes (Signed)
Code Blue initiated pt became bradycardic then PEA ACLS protocol initiated for 4 minutes prior to ROSC.  Sonda Rumbleana Raymone Pembroke, AGNP  Pulmonary/Critical Care Pager [phone] (please enter 7 digits) PCCM Consult Pager [phone] (please enter 7 digits)

## 2017-04-02 NOTE — Anesthesia Procedure Notes (Addendum)
Procedure Name: Intubation Date/Time: 03/24/2017 2:42 PM Performed by: Nelda Marseille, CRNA Pre-anesthesia Checklist: Patient identified, Patient being monitored, Timeout performed, Emergency Drugs available and Suction available Patient Re-evaluated:Patient Re-evaluated prior to induction Oxygen Delivery Method: Circle system utilized Preoxygenation: Pre-oxygenation with 100% oxygen Induction Type: IV induction Ventilation: Mask ventilation without difficulty Laryngoscope Size: Mac, 3 and McGraph Grade View: Grade III Tube type: Oral Tube size: 7.0 mm Number of attempts: 1 Airway Equipment and Method: Stylet and Video-laryngoscopy Placement Confirmation: ETT inserted through vocal cords under direct vision,  positive ETCO2 and breath sounds checked- equal and bilateral Secured at: 21 cm Tube secured with: Tape Dental Injury: Teeth and Oropharynx as per pre-operative assessment  Difficulty Due To: Difficult Airway- due to large tongue Comments: Pt was intubated and Mcgrath was used to place ETT.  Easy intubation and easy mask.  One attempt without problems.

## 2017-04-02 NOTE — Progress Notes (Signed)
RT called for Code Blue, upon arrival chest compressions in process pt on ventilator. Pt removed from ventilator and BVM at a rate of approx 20 breaths per min. After ROS pt placed back on vent, ABG drawn and EKG obtained per order. Will continue to monitor closely

## 2017-04-02 NOTE — Op Note (Signed)
03/28/2017  5:05 PM  PATIENT:  Sarah Schroeder    PRE-OPERATIVE DIAGNOSIS:  RIGHT ANKLE FRACTURE DISLOCATION  POST-OPERATIVE DIAGNOSIS:  Same  PROCEDURE:  OPEN REDUCTION INTERNAL FIXATION (ORIF) TRIMALLEOLAR RIGHT ANKLE FRACTURE  SURGEON:  Thornton Park, MD  ANESTHESIA:   General  PREOPERATIVE INDICATIONS:  Sarah Schroeder is a  47 y.o. female with a diagnosis of a displcacRIGHT ANKLE FRACTURE who failed conservative measures and elected for surgical management.    I discussed the risks and benefits of surgery. The risks include but are not limited to infection, bleeding requiring blood transfusion, nerve or blood vessel injury, joint stiffness or loss of motion, persistent pain, weakness or instability, malunion, nonunion and hardware failure and the need for further surgery. Medical risks include but are not limited to DVT and pulmonary embolism, myocardial infarction, stroke, pneumonia, respiratory failure and death. Patient understood these risks and wished to proceed.   OPERATIVE IMPLANTS: Synthes 3.5 LCDC 6 hole plate, Synthes 4.0 cannulated screws x 2 (32 and 40 mm)  OPERATIVE FINDINGS: Comminuted lateral and medial malleolus fractures, small posterior malleolus fracture  OPERATIVE PROCEDURE:   Patient was met in the preoperative area. The right leg was signed my initials and the word yes according the hospital's correct site of surgery protocol. She was brought to the operating room where she underwent general anesthesia. The patient was placed supine on the operative table.  A tourniquet was applied to the right thigh.  The lower extremity was prepped and draped in a sterile fashion. A timeout was performed to verify the patient's name, date of birth, medical record number, correct site of surgery and correct procedure to be performed. It was also used to verify the patient received antibiotics, and that all appropriate instruments, implants and radiographic studies were available in  the room. Once all in attendance were in agreement, the case began.  The right lower extremity was exsanguinated with an Esmarch. The tourniquet was inflated to 275 mmHg. This was applied for a total of 105 minutes. A lateral incision was made over the fibula. The subcutaneous tissues were dissected with the Metzenbaum scissor and pickup. Care was taken to avoid injury to the superficial peroneal nerve. The lateral malleolus fracture was identified and irrigated and fracture hematoma was removed. Soft tissue was removed from the fracture site using a periosteal elevator.    A 6 hole, 3.5 LCDC plate was then contoured and placed along the lateral fibula. Three bicortical screws were placed proximal to the fracture and a single locking screw was placed distal the fracture. The fracture reduction and hardware placement were confirmed on AP and lateral imaging.  Once the lateral malleolus was plated, the attention was turned to the medial ankle. A small vertical incision was made over the tip of the medial malleolus.  Soft tissue was dissected with some with the Metzenbaum scissor and pickup. The fracture of the medial malleolus was identified. This was found to be comminuted.   Two threaded K wires for the 4.0 cannulated screws were then advanced through the tip of the medial malleolus across the fracture site and into the distal tibia. The position of the K wires was evaluated on AP and lateral FluoroScan images. The length of the wires were measured with a depth gauge and were determined to be 32 and  9m in length. The wires were then overdrilled with a cannulated drill for the 4.0 cannulated screws. The long threaded 4.0 cannulated screws were then advanced into position by hand,  compressing the medial malleolus fracture.    The posterior malleolus was then examined under fluoroscopy. It was felt to be less than 20% of the articular surface and was in a near anatomic position. The decision was made made not  to place an AP screw given its stability and small size.  A stress test of the right ankle was then performed under fluoroscopy.  This test did not reveal any syndesmotic injury or opening of the medial clear space.  The medial and lateral incisions were then copiously irrigated. The skin approximated with 3.0 Nylon and staples. A dry sterile dressing was applied along with an AO splint. The patient's ankle was positioned in neutral. The pateint was then awoken from anesthesia, transferred to hospital bed and brought to the PACU in stable condition. I was scrubbed and present the entire case and all sharp and instrument counts were correct at conclusion the case. I spoke to the patient's family in the post-op consultation room to let them know the case was performed without complication and the patient was stable in recovery room.    Timoteo Gaul, MD

## 2017-04-02 NOTE — Progress Notes (Signed)
Rept to Walgreen[employer]acy RN in ICU.

## 2017-04-02 NOTE — Consult Note (Signed)
[facility]  Patient ID: Sarah MuskratLaura Zobel, MRN: [id number], DOB/AGE: May 06, 1970 47 y.o. Admit date: October 28, 2017   Date of Consult: 04/12/2017 Primary Physician: Nonda LouShapely-Quinn, Todd [place], MD Primary Cardiologist: [facility]  Chief Complaint:  Chief Complaint  Patient presents with  . Ankle Pain   Reason for Consult: Abnormal EKG with known coronary artery disease  HPI: 47 y.o. female with known coronary artery disease status post PCI and stent placement last year after patient had significant shortness of breath weakness and fatigue and chest pressure.  The patient has done very well with that PCI and stent placement on Plavix and aspirin but had a discontinuation of Plavix in recent months due to appropriate term of treatment.  Patient since then has physically been doing all the physical activity she wishes and currently has had no evidence of significant progression of shortness of breath chest discomfort weakness and/or fatigue.  She is physically active and does activity at school.  She also has dialysis 3 times per week and has had no further difficult symptoms during dialysis.  She has had episode for which she has fallen and broken her ankle and needs surgical intervention.  EKG today has shown normal sinus rhythm with some peaked T waves but no other EKG changes consistent with a cardiovascular event.  Troponin level is 0.06 most consistent with kidney disease and has not had any peak consistent with cardiovascular event.  Currently she is stable and comfortable with no evidence of symptoms  Past Medical History:  Diagnosis Date  . Arrhythmia   . Asthma   . CAD (coronary artery disease)   . CHF (congestive heart failure) (HCC)   . Chronic kidney disease   . Dialysis patient (HCC)    Mon, Wed, Fri  . DM (diabetes mellitus), type 2 (HCC)   . DVT (deep venous thrombosis) (HCC)    right arm  . Elevated lipids   . GERD (gastroesophageal reflux disease)   .  Gout   . Hypertension   . Osteomyelitis (HCC)    chronic  . PVD (peripheral vascular disease) (HCC)   . Wears dentures    full upper and lower      Surgical History:  Past Surgical History:  Procedure Laterality Date  . ANKLE SURGERY Bilateral    left(09/13/13), right(07/12/14) [facility]  . AV FISTULA PLACEMENT Right 09/27/2014   Procedure: ARTERIOVENOUS (AV) FISTULA CREATION;  Surgeon: Annice NeedyJason S Dew, MD;  Location: [facility] ORS;  Service: Vascular;  Laterality: Right;  . CARDIAC CATHETERIZATION  07/17/2015   [facility] (x1)  . CARPAL TUNNEL RELEASE Right 06/23/2016   [facility]  . ENDOMETRIAL ABLATION  2009  . pd catheter insertion  2013  . PERITONEAL CATHETER REMOVAL  09/30/2015   [facility]  . stents in legs Bilateral   . TOE AMPUTATION Bilateral   . 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 TRIGGER FINGER RELEASE Right 06/23/2016   [facility]     Home Meds: Prior to Admission medications   Medication Sig Start Date End Date Taking? Authorizing Provider  allopurinol (ZYLOPRIM) 100 MG tablet Take 100 mg by mouth daily.   Yes [provider]  amLODipine (NORVASC) 10 MG tablet Take 10 mg by mouth daily.   Yes [provider]  aspirin 81 MG tablet Take 81 mg by mouth daily.   Yes [provider]  calcium acetate, Phos Binder, (PHOSLYRA) 667 MG/5ML SOLN Take 667 mg by mouth 3 (three) times daily with meals.  Yes [provider]  esomeprazole (NEXIUM) 20 MG capsule Take 20 mg by mouth daily at 12 noon.   Yes [provider]  fluconazole (DIFLUCAN) 100 MG tablet Take 100 mg by mouth 3 (three) times a week. Mon. Wed. Friday   Yes [provider]  gabapentin (NEURONTIN) 100 MG capsule Take 100 mg by mouth 2 (two) times daily. Alternate with lyrica every month    Yes [provider]  insulin glargine (LANTUS) 100 UNIT/ML injection Inject 3 Units into the skin at bedtime.   Yes [provider]  insulin lispro (HUMALOG) 100  UNIT/ML injection Inject 1-2 Units into the skin 3 (three) times daily before meals.    Yes [provider]  losartan (COZAAR) 50 MG tablet Take 50 mg by mouth daily.   Yes [provider]  metoCLOPramide (REGLAN) 10 MG tablet Take 10 mg by mouth daily.   Yes [provider]  metoprolol tartrate (LOPRESSOR) 50 MG tablet Take 50 mg by mouth 2 (two) times daily.   Yes [provider]  ondansetron (ZOFRAN) 4 MG tablet Take 4 mg by mouth every 8 (eight) hours as needed for nausea or vomiting.   Yes [provider]  potassium chloride (K-DUR) 10 MEQ tablet Take 10 mEq by mouth daily.   Yes [provider]  albuterol (PROVENTIL HFA;VENTOLIN HFA) 108 (90 BASE) MCG/ACT inhaler Inhale 2 puffs into the lungs every 4 (four) hours as needed for wheezing or shortness of breath.    [provider]  gentamicin cream (GARAMYCIN) 0.1 % Apply 1 application topically daily. Mon, Wed, Fri    [provider]  HYDROcodone-acetaminophen (NORCO) 5-325 MG per tablet Take 1 tablet by mouth every 6 (six) hours as needed for moderate pain. Patient not taking: Reported on 03/25/2017 09/27/14   Annice Needy, MD  megestrol (MEGACE) 40 MG tablet Take 40 mg by mouth daily as needed.    [provider]  promethazine (PHENERGAN) 25 MG tablet Take 25 mg by mouth every 6 (six) hours as needed for nausea or vomiting.    [provider]  zolpidem (AMBIEN) 5 MG tablet Take 5 mg by mouth at bedtime as needed for sleep.    [provider]    Inpatient Medications:  . allopurinol  100 mg Oral Daily  . amLODipine  10 mg Oral Daily  . calcium acetate (Phos Binder)  667 mg Oral TID WC  . docusate sodium  100 mg Oral BID  . fluconazole  100 mg Oral Once per day on Mon Wed Fri  . gabapentin  100 mg Oral BID  . insulin aspart  0-5 Units Subcutaneous Q4H  . insulin detemir  2 Units Subcutaneous QHS  . metoCLOPramide  10 mg Oral Daily  .  metoprolol tartrate  50 mg Oral BID  . potassium chloride  10 mEq Oral Daily   . sodium chloride      Allergies:  Allergies  Allergen Reactions  . Lisinopril Cough    Social History   Socioeconomic History  . Marital status: Single    Spouse name: Not on file  . Number of children: Not on file  . Years of education: Not on file  . Highest education level: Not on file  Social Needs  . Financial resource strain: Not on file  . Food insecurity - worry: Not on file  . Food insecurity - inability: Not on file  . Transportation needs - medical: Not on file  .  Transportation needs - non-medical: Not on file  Occupational History  . Not on file  Tobacco Use  . Smoking status: Never Smoker  . Smokeless tobacco: Never Used  Substance and Sexual Activity  . Alcohol use: No  . Drug use: No  . Sexual activity: Not on file  Other Topics Concern  . Not on file  Social History Narrative  . Not on file     History reviewed. No pertinent family history.   Review of Systems Positive for limb pain Negative for: General:  chills, fever, night sweats or weight changes.  Cardiovascular: PND orthopnea syncope dizziness  Dermatological skin lesions rashes Respiratory: Cough congestion Urologic: Frequent urination urination at night and hematuria Abdominal: negative for nausea, vomiting, diarrhea, bright red blood per rectum, melena, or hematemesis Neurologic: negative for visual changes, and/or hearing changes  All other systems reviewed and are otherwise negative except as noted above.  Labs: Recent Labs    04/28/2017 1107 04/28/17 1224 04/28/17 1815 04/13/2017 0034  TROPONINI 0.05* 0.06* 0.06* 0.05*   Lab Results  Component Value Date   WBC 9.3 04/14/2017   HGB 11.4 (L) 03/24/2017   HCT 35.4 04/08/2017   MCV 97.1 03/29/2017   PLT 204 04/01/2017    Recent Labs  Lab 2017/04/28 1224 04/28/2017 1815 04/02/17 0458  NA 138 140  --   K 5.9* 5.3* 4.7  CL 96* 97*  --   CO2 26  26  --   BUN 53* 53*  --   CREATININE 8.11* 8.62*  --   CALCIUM 9.4 9.2  --   PROT 7.9  --   --   BILITOT 0.7  --   --   ALKPHOS 148*  --   --   ALT 15  --   --   AST 18  --   --   GLUCOSE 114* 198*  --    No results found for: CHOL, HDL, LDLCALC, TRIG No results found for: DDIMER  Radiology/Studies:  Dg Ankle Complete Right  Result Date: April 28, 2017 CLINICAL DATA:  Fall on ramp.  Right ankle pain. EXAM: RIGHT ANKLE - COMPLETE 3+ VIEW COMPARISON:  None. FINDINGS: Displaced comminuted fractures are present in the distal fibula and at the medial malleolus. The talus is displaced laterally. The fractures are displaced 1 shaft width. A posterior tibial fractures present as well. Moderate osteopenia is noted. Vascular calcifications are present. IMPRESSION: 1. Comminuted trimalleolar fracture with lateral displacement of the talus. 2. Distal fibular fracture is displaced 1 shaft width. 3. Moderate osteopenia. 4. Microvascular calcifications compatible with known diagnosis of diabetes. Electronically Signed   By: Marin Roberts M.D.   On: 04/28/2017 11:01   Dg Chest Portable 1 View  Result Date: 04/28/17 CLINICAL DATA:  Preop. EXAM: PORTABLE CHEST 1 VIEW COMPARISON:  None. FINDINGS: Midline trachea. Mild cardiomegaly. Atherosclerosis in the transverse aorta. No pleural effusion or pneumothorax. A right axillary vascular stent. No congestive failure. Clear lungs. IMPRESSION: Cardiomegaly without congestive failure. Aortic Atherosclerosis (ICD10-I70.0).  This is age advanced. Electronically Signed   By: Jeronimo Greaves M.D.   On: 2017-04-28 11:48   Dg Ankle Right Port  Result Date: 28-Apr-2017 CLINICAL DATA:  Post reduction in plaster images EXAM: PORTABLE RIGHT ANKLE - 2 VIEW COMPARISON:  Pre reduction ankle series of today's date FINDINGS: The patient has undergone close reduction of the comminuted trimalleolar fracture. There remains some disruption of the ankle joint mortise with lateral  migration of the talar dome with respect to  the tibial plafond. The distal fibular fracture has been partially reduced as has the medial malleolar fracture. Involving the distal IMPRESSION: Partial reduction of the trimalleolar fracture of the right ankle with persistent disruption of the ankle joint mortise. Electronically Signed   By: David  [place] M.D.   On: 04-17-2017 13:24    EKG: Normal sinus rhythm with nonspecific T wave changes  Weights: Filed Weights   04/17/2017 1640 April 17, 2017 2051 04/17/2017 2125  Weight: 63.6 kg (140 lb 3.4 oz) 62.1 kg (136 lb 14.5 oz) 62 kg (136 lb 11.2 oz)     Physical Exam: Blood pressure 125/67, pulse 77, temperature 99.5 F (37.5 C), temperature source Oral, resp. rate 19, height 5\' 4"  (1.626 m), weight 62 kg (136 lb 11.2 oz), SpO2 100 %. Body mass index is 23.46 kg/m. General: Well developed, well nourished, in no acute distress. Head eyes ears nose throat: Normocephalic, atraumatic, sclera non-icteric, no xanthomas, nares are without discharge. No apparent thyromegaly and/or mass  Lungs: Normal respiratory effort.  no wheezes, no rales, no rhonchi.  Heart: RRR with normal S1 S2. no murmur gallop, no rub, PMI is normal size and placement, carotid upstroke normal without bruit, jugular venous pressure is normal Abdomen: Soft, non-tender, non-distended with normoactive bowel sounds. No hepatomegaly. No rebound/guarding. No obvious abdominal masses. Abdominal aorta is normal size without bruit Extremities: No edema. no cyanosis, no clubbing, no ulcers with right upper arm shunt Peripheral : 2+ bilateral upper extremity pulses, 2+ bilateral femoral pulses, 2+ bilateral dorsal pedal pulse Neuro: Alert and oriented. No facial asymmetry. No focal deficit. Moves all extremities spontaneously. Musculoskeletal: Normal muscle tone without kyphosis Psych:  Responds to questions appropriately with a normal affect.    Assessment: 47 year old female with known coronary  artery disease status post previous PCI and stent placement without evidence of myocardial infarction congestive heart failure or anginal equivalent at this time at lowest risk possible for cardiovascular complication with surgical intervention orthopedic surgery  Plan: 1.  Proceed to surgery without restrictions due to lowest risk possible 2.  Continue antihypertensive medication management for further risk reduction cardiovascular event 3.  Metoprolol for heart rate control and risk reduction cardiovascular event 4.  Continue aspirin for further risk reduction cardiovascular event 5.  No further cardiac diagnostics necessary at this time 6.  No restrictions to rehabilitation or resumption of dialysis at this time  Signed, Lamar Blinks M.D. [facility] Cardiology 04/06/2017, 1:08 PM

## 2017-04-02 NOTE — Progress Notes (Signed)
EVENT NOTE:  After she began with the patient's family in the postop consultation room, I returned to the PACU. As I was entering orders for the nursing staff noted that patient was having no pulse.  Patient was noted to have bradycardia which then turned to asystole. A code was called.  The ER attending responded and ran the code initially. I helped to provide chest compressions.  During the code the patient was intubated by the anesthesia service. A pulse was regained. The ICU attending responded to complete the code. The patient had a second episode of bradycardia to asystole and was shocked twice. Another pulse was regained. Patient was transferred to the ICU.  After a pulse was regained the first time I went to speak with the patient's family with Dr. Hennie DuosWietting from the hospitalist service in the hospital chaplain.  I informed the patient's sister who was in her hospital room at the patient had an episode where her heart had stopped. She understood that her sister was in critical condition even though her heart had been restarted.  The chaplain offered to bring the patient's sister to the ICU waiting room. The patient's sister understood what had happened and understands the gravity of the situation.  I will continue to follow the patient's progress along with the intensive care team and hospitalists.

## 2017-04-02 NOTE — Anesthesia Post-op Follow-up Note (Signed)
Anesthesia QCDR form completed.        

## 2017-04-02 NOTE — Progress Notes (Signed)
Pt transferred to PACU without incident per order accompanied by family after being cleared medically by cardiology. Prior to transfer BS rechecked. BS prior to transfer 52 mg/dl. Amp of D50 given again per protocol. Prior transfer this reported to CBS CorporationDeborah RN in short stay so she could follow up.

## 2017-04-02 NOTE — Anesthesia Postprocedure Evaluation (Signed)
Anesthesia Post Note  Patient: Sarah MuskratLaura Schroeder  Procedure(s) Performed: OPEN REDUCTION INTERNAL FIXATION (ORIF) ANKLE FRACTURE (Right )  Patient location during evaluation: PACU Anesthesia Type: General Level of consciousness: awake and alert Pain management: pain level controlled Vital Signs Assessment: post-procedure vital signs reviewed and stable Respiratory status: spontaneous breathing, nonlabored ventilation, respiratory function stable and patient connected to nasal cannula oxygen Cardiovascular status: blood pressure returned to baseline and stable Postop Assessment: no apparent nausea or vomiting Anesthetic complications: no     Last Vitals:  Vitals:   03/22/2017 1730 04/10/2017 1746  BP: (!) 41/31   Pulse:    Resp: 15   Temp:    SpO2:  100%    Last Pain:  Vitals:   03/21/2017 1350  TempSrc: Tympanic  PainSc: 7                  Yevette EdwardsJames G Ladina Shutters

## 2017-04-02 NOTE — Progress Notes (Addendum)
Pt called RN to room stating that she "felt like her blood sugar was dropping". Pt is diaphoretic but talking to family. Blood sugar obtained per NT BS 19 mg/dl. Amp of D50 given per protocol. Dr. Renae GlossWieting and Gavin Poundeborah RN in short stay aware. Will continue to monitor.

## 2017-04-02 NOTE — Progress Notes (Signed)
   2017-05-04 1630  Clinical Encounter Type  Visited With Family  Visit Type Initial;Post-op;Code;Critical Care  Referral From Nurse  Consult/Referral To Chaplain  Spiritual Encounters  Spiritual Needs Prayer;Emotional  Stress Factors  Family Stress Factors Health changes   Chaplain responded to code blue in PACU.  Chaplain then provided emotional and spiritual support to family in ICU.  Family is very religous.  Chaplain provided a prayer and continues to be available for support.  Rev. Quadry Kampa Zenaida NieceVan [employer]al[employer] Chaplain

## 2017-04-02 NOTE — Progress Notes (Signed)
Patient ID: Sarah Schroeder, female   DOB: Dec 22, 1970, 47 y.o.   MRN: [id number]  [employer] [facility] PROGRESS NOTE  Sarah Schroeder WUJ:[id number] DOB: 1971/02/05 DOA: 03/29/2017 PCP: Nonda Lou, MD  HPI/Subjective: Patient stated she slipped on a little ice on a ramp.  Patient usually walks with a push walker.  She states her balance is not been good with her toes being amputated.  Felt well prior to the fall.  No complaints of chest pain or shortness of breath.  Objective: Vitals:   03/20/2017 2123 05/02/17 0318  BP: 139/78 126/77  Pulse: 97 86  Resp:  19  Temp: 98.5 F (36.9 C) 99.8 F (37.7 C)  SpO2: 99% 94%    Filed Weights   03/31/2017 1640 04/11/2017 2051 04/02/2017 2125  Weight: 63.6 kg (140 lb 3.4 oz) 62.1 kg (136 lb 14.5 oz) 62 kg (136 lb 11.2 oz)    ROS: Review of Systems  Constitutional: Negative for chills and fever.  Eyes: Negative for blurred vision.  Respiratory: Negative for cough and shortness of breath.   Cardiovascular: Negative for chest pain.  Gastrointestinal: Negative for abdominal pain, constipation, diarrhea, nausea and vomiting.  Genitourinary: Negative for dysuria.  Musculoskeletal: Positive for falls and joint pain.  Neurological: Negative for dizziness and headaches.   Exam: Physical Exam  Constitutional: She is oriented to person, place, and time.  HENT:  Nose: No mucosal edema.  Mouth/Throat: No oropharyngeal exudate or posterior oropharyngeal edema.  Eyes: Conjunctivae, EOM and lids are normal. Pupils are equal, round, and reactive to light.  Neck: No JVD present. Carotid bruit is not present. No edema present. No thyroid mass and no thyromegaly present.  Cardiovascular: S1 normal and S2 normal. Exam reveals no gallop.  Murmur heard.  Systolic murmur is present with a grade of 3/6. Pulses:      Dorsalis pedis pulses are 2+ on the right side, and 2+ on the left side.  Respiratory: No respiratory distress. She has no wheezes. She has  no rhonchi. She has no rales.  GI: Soft. Bowel sounds are normal. There is no tenderness.  Musculoskeletal:       Right ankle: She exhibits no swelling.       Left ankle: She exhibits no swelling.  Lymphadenopathy:    She has no cervical adenopathy.  Neurological: She is alert and oriented to person, place, and time. No cranial nerve deficit.  Skin: Skin is warm. No rash noted. Nails show no clubbing.  Psychiatric: She has a normal mood and affect.      Data Reviewed: Basic Metabolic Panel: Recent Labs  Lab 04/02/2017 1224 03/27/2017 1815  NA 138 140  K 5.9* 5.3*  CL 96* 97*  CO2 26 26  GLUCOSE 114* 198*  BUN 53* 53*  CREATININE 8.11* 8.62*  CALCIUM 9.4 9.2  PHOS  --  8.4*   Liver Function Tests: Recent Labs  Lab 04/02/2017 1224  AST 18  ALT 15  ALKPHOS 148*  BILITOT 0.7  PROT 7.9  ALBUMIN 3.7   CBC: Recent Labs  Lab 04/08/2017 1105 02-May-2017 0457  WBC 7.0 9.3  NEUTROABS 5.1  --   HGB 12.9 11.4*  HCT 40.7 35.4  MCV 96.5 97.1  PLT 205 204   Cardiac Enzymes: Recent Labs  Lab 04/09/2017 1107 03/18/2017 1224 04/12/2017 1815 05/02/2017 0034  TROPONINI 0.05* 0.06* 0.06* 0.05*    CBG: Recent Labs  Lab 04/04/2017 2117 May 02, 2017 0202  GLUCAP 212* 154*    Recent Results (from  the past 240 hour(s))  Surgical PCR screen     Status: None   Collection Time: 04/04/2017  7:21 PM  Result Value Ref Range Status   MRSA, PCR NEGATIVE NEGATIVE Final   Staphylococcus aureus NEGATIVE NEGATIVE Final    Comment: (NOTE) The Xpert SA Assay (FDA approved for NASAL specimens in patients 49 years of age and older), is one component of a comprehensive surveillance program. It is not intended to diagnose infection nor to guide or monitor treatment. Performed at [facility], [address] [place], [place] [address]      Studies: Dg Ankle Complete Right  Result Date: 03/29/2017 CLINICAL DATA:  Fall on ramp.  Right ankle pain. EXAM: RIGHT ANKLE - COMPLETE 3+ VIEW  COMPARISON:  None. FINDINGS: Displaced comminuted fractures are present in the distal fibula and at the medial malleolus. The talus is displaced laterally. The fractures are displaced 1 shaft width. A posterior tibial fractures present as well. Moderate osteopenia is noted. Vascular calcifications are present. IMPRESSION: 1. Comminuted trimalleolar fracture with lateral displacement of the talus. 2. Distal fibular fracture is displaced 1 shaft width. 3. Moderate osteopenia. 4. Microvascular calcifications compatible with known diagnosis of diabetes. Electronically Signed   By: Marin Robertshristopher  Mattern M.D.   On: 03/30/2017 11:01   Dg Chest Portable 1 View  Result Date: 03/24/2017 CLINICAL DATA:  Preop. EXAM: PORTABLE CHEST 1 VIEW COMPARISON:  None. FINDINGS: Midline trachea. Mild cardiomegaly. Atherosclerosis in the transverse aorta. No pleural effusion or pneumothorax. A right axillary vascular stent. No congestive failure. Clear lungs. IMPRESSION: Cardiomegaly without congestive failure. Aortic Atherosclerosis (ICD10-I70.0).  This is age advanced. Electronically Signed   By: Jeronimo GreavesKyle  Talbot M.D.   On: 03/30/2017 11:48   Dg Ankle Right Port  Result Date: 04/09/2017 CLINICAL DATA:  Post reduction in plaster images EXAM: PORTABLE RIGHT ANKLE - 2 VIEW COMPARISON:  Pre reduction ankle series of today's date FINDINGS: The patient has undergone close reduction of the comminuted trimalleolar fracture. There remains some disruption of the ankle joint mortise with lateral migration of the talar dome with respect to the tibial plafond. The distal fibular fracture has been partially reduced as has the medial malleolar fracture. Involving the distal IMPRESSION: Partial reduction of the trimalleolar fracture of the right ankle with persistent disruption of the ankle joint mortise. Electronically Signed   By: David  Swazil[place]Jordan M.D.   On: 03/26/2017 13:24    Scheduled Meds: . allopurinol  100 mg Oral Daily  . amLODipine  10 mg  Oral Daily  . aspirin EC  81 mg Oral Daily  . calcium acetate (Phos Binder)  667 mg Oral TID WC  . docusate sodium  100 mg Oral BID  . fluconazole  100 mg Oral Once per day on Mon Wed Fri  . gabapentin  100 mg Oral BID  . insulin aspart  0-9 Units Subcutaneous TID WC  . metoCLOPramide  10 mg Oral Daily  . metoprolol tartrate  50 mg Oral BID  . potassium chloride  10 mEq Oral Daily   Continuous Infusions: . sodium chloride      Assessment/Plan:  1.  Preoperative consultation for trimalleolar fracture.  No contraindications to surgery at this time.  Weightbearing bones must be repaired in order to walk again as quickly as possible. 2.  Borderline elevated troponin.  This is elevated with end-stage renal disease.  Troponins are flat.  No complaints of chest pain or shortness of breath. 3.  End-stage renal disease on dialysis  with hyperkalemia.  Dialysis was performed yesterday.  Continue dialysis on Monday Wednesday Friday schedule. 4.  Accelerated hypertension secondary to pain blood pressure much improved. 5.  History of CAD.  Hold aspirin with surgery today.  Continue metoprolol. 6.  Labile diabetes.  On sliding scale for right now. 7.  Neuropathy on gabapentin 8.  History of congestive heart failure.  No signs currently 9.  History of gout on low-dose allopurinol  Code Status:     Code Status Orders  (From admission, onward)        Start     Ordered   04/13/2017 1256  Full code  Continuous     04/10/2017 1300    Code Status History    Date Active Date Inactive Code Status Order ID Comments User Context   This patient has a current code status but no historical code status.      Disposition Plan: As per orthopedic surgery  Consultants:  Orthopedic surgery  Nephrology  Time spent: 25 minutes  Estalene Bergey [employer]

## 2017-04-02 NOTE — Progress Notes (Signed)
Discussed prognosis with pts family primarily son and mother they have decided to change pts code status to DO NOT RESUSCITATE due to poor prognosis and multiple PEA arrests.  Therefore, DNR and RN MAY PRONOUNCE orders placed.  Sonda Rumbleana Blakeney, AGNP  Pulmonary/Critical Care Pager [phone] (please enter 7 digits) PCCM Consult Pager [phone] (please enter 7 digits)

## 2017-04-02 NOTE — ED Provider Notes (Signed)
[facility]  Department of Emergency Medicine   Code Blue CONSULT NOTE  Chief Complaint: Cardiac arrest/unresponsive   Level V Caveat: Unresponsive  History of present illness: I was contacted by the hospital for a CODE BLUE cardiac arrest upstairs and presented to the patient's bedside.  Patient apparently had a cardiac arrest shortly after a procedure.  I came to bedside anesthesia was already there, they were attempting intubation and CPR was in progress.   ROS: Unable to obtain, Level V caveat  Scheduled Meds: . allopurinol  100 mg Oral Daily  . amLODipine  10 mg Oral Daily  . calcium acetate (Phos Binder)  667 mg Oral TID WC  . chlorhexidine gluconate (MEDLINE KIT)  15 mL Mouth Rinse BID  . dextrose      . docusate sodium  100 mg Oral BID  . EPINEPHrine      . fentaNYL (SUBLIMAZE) injection  50 mcg Intravenous Once  . fluconazole  100 mg Oral Once per day on Mon Wed Fri  . gabapentin  100 mg Oral BID  . insulin aspart  0-4 Units Subcutaneous Q4H  . insulin detemir  2 Units Subcutaneous QHS  . [START ON 2017/04/04] mouth rinse  15 mL Mouth Rinse QID  . metoCLOPramide  10 mg Oral Daily  . metoprolol tartrate  50 mg Oral BID  . pantoprazole (PROTONIX) IV  40 mg Intravenous Daily  . potassium chloride  10 mEq Oral Daily   Continuous Infusions: . sodium chloride    . fentaNYL infusion INTRAVENOUS    . norepinephrine (LEVOPHED) Adult infusion    .  sodium bicarbonate (isotonic) infusion in sterile water     PRN Meds:.albuterol, bisacodyl, bisacodyl, fentaNYL, magnesium citrate, morphine injection, oxyCODONE, polyethylene glycol, sennosides, zolpidem Past Medical History:  Diagnosis Date  . Arrhythmia   . Asthma   . CAD (coronary artery disease)   . CHF (congestive heart failure) ([place])   . Chronic kidney disease   . Dialysis patient ([place])    Mon, Wed, Fri  . DM (diabetes mellitus), type 2 ([place])   . DVT (deep venous thrombosis) ([facility])    right arm  .  Elevated lipids   . GERD (gastroesophageal reflux disease)   . Gout   . Hypertension   . Osteomyelitis ([facility])    chronic  . PVD (peripheral vascular disease) ([place])   . Wears dentures    full upper and lower   Past Surgical History:  Procedure Laterality Date  . ANKLE SURGERY Bilateral    left(09/13/13), right(07/12/14) [facility]  . AV FISTULA PLACEMENT Right 09/27/2014   Procedure: ARTERIOVENOUS (AV) FISTULA CREATION;  Surgeon: Algernon Huxley, MD;  Location: [facility] ORS;  Service: Vascular;  Laterality: Right;  . CARDIAC CATHETERIZATION  07/17/2015   [facility] (x1)  . CARPAL TUNNEL RELEASE Right 06/23/2016   [facility]  . ENDOMETRIAL ABLATION  2009  . pd catheter insertion  2013  . PERITONEAL CATHETER REMOVAL  09/30/2015   [facility]  . stents in legs Bilateral   . TOE AMPUTATION Bilateral   . 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 TRIGGER FINGER RELEASE Right 06/23/2016   [facility]   Social History   Socioeconomic History  . Marital status: Single    Spouse name: Not on file  . Number of children: Not on file  . Years of education: Not on file  . Highest education level: Not on file  Social Needs  . Financial resource strain: Not  on file  . Food insecurity - worry: Not on file  . Food insecurity - inability: Not on file  . Transportation needs - medical: Not on file  . Transportation needs - non-medical: Not on file  Occupational History  . Not on file  Tobacco Use  . Smoking status: Never Smoker  . Smokeless tobacco: Never Used  Substance and Sexual Activity  . Alcohol use: No  . Drug use: No  . Sexual activity: Not on file  Other Topics Concern  . Not on file  Social History Narrative  . Not on file   Allergies  Allergen Reactions  . Lisinopril Cough    Last set of Vital Signs (not current) Vitals:   04/16/2017 1729 04/04/2017 1730  BP:  (!) 41/31  Pulse: 65   Resp: 11 15  Temp:    SpO2: 91%       Physical Exam  Gen: unresponsive Cardiovascular:  pulseless  Resp: apneic. Breath sounds equal bilaterally with bagging  Abd: nondistended  Neuro: GCS 3, unresponsive to pain  Neck: No crepitus  Musculoskeletal: No deformity  Skin: warm  Procedures  INTUBATION  CRITICAL CARE Performed by: Schuyler Amor Total critical care time: 35 Critical care time was exclusive of separately billable procedures and treating other patients. Critical care was necessary to treat or prevent imminent or life-threatening deterioration. Critical care was time spent personally by me on the following activities: development of treatment plan with patient and/or surrogate as well as nursing, discussions with consultants, evaluation of patient's response to treatment, examination of patient, obtaining history from patient or surrogate, ordering and performing treatments and interventions, ordering and review of laboratory studies, ordering and review of radiographic studies, pulse oximetry and re-evaluation of patient's condition.  Cardiopulmonary Resuscitation (CPR) Procedure Note I was instrumental in helping with this condition in addition to other providers who are at bedside.  We did try bicarb, had a return of spontaneous circulation after I think 5 minutes down.  This patient will remain [place] responsive.  She was intubated by anesthesia,  Directed/Performed by: Schuyler Amor I personally directed ancillary staff and/or performed CPR in an effort to regain return of spontaneous circulation and to maintain cardiac, neuro and systemic perfusion.    Medical Decision making  Cardiac arrest unclear etiology, had return of spontaneous circulation  Assessment and Plan  She was on the care directly of ICU staff when I left.    Schuyler Amor, MD 04/11/2017 [phone]

## 2017-04-02 NOTE — Progress Notes (Signed)
Patient ID: Sarah MuskratLaura Schroeder, female   DOB: March 01, 1971, 47 y.o.   MRN: [id number]  ACP note  Responded to CODE BLUE. Patient had pulses after 10 minutes of CPR. Case discussed with Magdalene critical care AP. Patient will be transferred to the ICU. Patient sugar was 147.  The patient went through the surgery and then in the PACU had bradycardia episode and coded.  Upon reintubation there was an awful lot of blood that was in the ET tube.  Chest x-ray and EKG pending.  Laboratory data sent off.  Patient placed on levo fed.  I went to the patient's room to speak with family along with Dr. Martha ClanKrasinski and the chaplain.  I explained that the patient had a cardiac arrest and there is a high likelihood that this can happen again.  We just need to watch things over the next couple days and see which way things turned.  High likelihood that this can happen again.  Patient unable to participate in ACP discussion at this point Spoke with family in the patient's room.  Time spent on ACP discussion 18 minutes  Dr. Alford Highlandichard Arden [place]

## 2017-04-02 NOTE — Progress Notes (Signed)
Pt laughing and visiting with family. No s/sx of distress and no c/o such. BS retaken 106 mg/dl. Awaiting cardiology consult. Will continue to monitor.

## 2017-04-02 NOTE — Progress Notes (Signed)
Inpatient Diabetes Program Recommendations  AACE/ADA: New Consensus Statement on Inpatient Glycemic Control (2015)  Target Ranges:  Prepandial:   less than 140 mg/dL      Peak postprandial:   less than 180 mg/dL (1-2 hours)      Critically ill patients:  140 - 180 mg/dL   Lab Results  Component Value Date   GLUCAP 203 (H) 03/22/2017   HGBA1C 6.5 (H) 03/25/2017    Review of Glycemic Control  Results for Sarah MuskratRICHMOND, Catelyn (MRN [id number]) as of 04/12/2017 15:13  Ref. Range 03/17/2017 10:25 04/13/2017 11:21 04/14/2017 12:07 04/07/2017 13:28 04/06/2017 13:50  Glucose-Capillary Latest Ref Range: 65 - 99 mg/dL 73 19 (LL) 119106 (H) 52 (L) 203 (H)   Diabetes history:DM2 hx (per Endocrinologist note patient is treated as DM1)  Outpatient Diabetes medications:Lantus 3 units QHS, Humalog 1-2 units TID with meals  Current orders for Inpatient glycemic control:Novolog 0-9 units TID with meals  Inpatient Diabetes Program Recommendations:   Since the patient had a low blood sugar this morning, please consider decreasing Novolog correction scale to 0-4 units tid/hs  Insulin - Basal:Please consider ordering Levemir 2 units Q24H.  Correction (SSI): Please consider decreasing Novolog correction scale to Novolog 0-4 units custom scale ACHS or Q4Hif patient will remain NPO.  -Custom Novolog correction scale 0-5 unitsACHS  151-200 0 unit 201-250 1 units 251-300 2 units 301-350 3 units 351-400 4 units A1C:Please consider ordering an A1C to evaluate glycemic control over the past 2-3 months.   Susette RacerJulie Zariah Jost, RN, BA, MHA, CDE Diabetes Coordinator Inpatient Diabetes Program  [phone] (Team Pager) [phone] [facility] Office) 03/27/2017 3:15 PM

## 2017-04-03 DIAGNOSIS — R570 Cardiogenic shock: Secondary | ICD-10-CM

## 2017-04-03 DIAGNOSIS — I469 Cardiac arrest, cause unspecified: Secondary | ICD-10-CM

## 2017-04-03 LAB — HEPATITIS B CORE ANTIBODY, TOTAL: Hep B Core Total Ab: NEGATIVE

## 2017-04-03 LAB — TYPE AND SCREEN
ABO/RH(D): B POS
ANTIBODY SCREEN: NEGATIVE
UNIT DIVISION: 0

## 2017-04-03 LAB — BPAM RBC
BLOOD PRODUCT EXPIRATION DATE: 201902072359
ISSUE DATE / TIME: 201901172015
Unit Type and Rh: 7300

## 2017-04-03 LAB — HEPATITIS B SURFACE ANTIBODY,QUALITATIVE: Hep B S Ab: REACTIVE

## 2017-04-03 MED FILL — Medication: Qty: 2 | Status: AC

## 2017-04-06 ENCOUNTER — Encounter: Payer: Self-pay | Admitting: Orthopedic Surgery

## 2017-04-07 ENCOUNTER — Ambulatory Visit: Admission: RE | Admit: 2017-04-07 | Payer: Medicare Other | Source: Ambulatory Visit | Admitting: Ophthalmology

## 2017-04-07 HISTORY — DX: Dependence on renal dialysis: Z99.2

## 2017-04-07 HISTORY — DX: Gastro-esophageal reflux disease without esophagitis: K21.9

## 2017-04-07 HISTORY — DX: Type 2 diabetes mellitus without complications: E11.9

## 2017-04-07 HISTORY — DX: Presence of dental prosthetic device (complete) (partial): Z97.2

## 2017-04-07 SURGERY — PHACOEMULSIFICATION, CATARACT, WITH IOL INSERTION
Anesthesia: Topical | Laterality: Right

## 2017-04-17 NOTE — Progress Notes (Signed)
Troponin reported , 5.08.

## 2017-04-17 NOTE — Progress Notes (Signed)
NP aware of Na level of 169, NS stopped.  NP aware of CO2 level of 49.

## 2017-04-17 NOTE — Death Summary Note (Signed)
DEATH SUMMARY   Patient Details  Name: Sarah Schroeder MRN: [id number] DOB: 12-30-1970  Admission/Discharge Information   Admit Date:  Apr 02, 2017  Date of Death:  Apr 04, 2017  Time of Death:  Jul 23, 2022  Length of Stay: 2  Referring Physician: Nonda Lou, MD   Reason(s) for Hospitalization  Right Ankle Fracture   Diagnoses  Preliminary cause of death: Cardiac arrest [facility]) Secondary Diagnoses (including complications and co-morbidities):  Active Problems:   Closed right ankle fracture   Cardiogenic Shock    Multiorgan Failure    Acute Pulmonary Edema   Brief Hospital Course (including significant findings, care, treatment, and services provided and events leading to death)  Sarah Schroeder is a 47 y.o. year old female who presented to [facility] ER April 02, 2017 following a fall at home and was found to have a right ankle fracture dislocation.  After failed attempts of conservative measures orthopedic surgery recommended surgical management.  Prior to surgery cardiology consulted 03/17/2017 due to EKG revealing normal sinus rhythm with some peaked T waves and troponin mildly elevated at 0.06.  Following evaluation cardiology concluded pt could proceed with surgery without restrictions and elevated troponin likely secondary to demand ischemia.  On 03/29/2017 the pt underwent an open reduction internal fixation trimalleolar to repair right ankle fracture.  Following surgery the pt was extubated then developed bradycardia and sudden PEA arrest requiring reintubation.  EKG obtained concerning for acute MI.  She was subsequently transferred to ICU 03-Apr-2017 postop and developed cardiogenic shock with multiorgan failure requiring vasopressors.  While in ICU despite aggressive treatment the pt had multiple episodes of PEA.  Therefore, pts family changed pts code status to DNR and pt expired 04-04-17 at 0007.   Pertinent Labs and Studies  Significant Diagnostic Studies Dg Ankle Complete Right  Result  Date: 03/17/2017 CLINICAL DATA:  Fall on ramp.  Right ankle pain. EXAM: RIGHT ANKLE - COMPLETE 3+ VIEW COMPARISON:  None. FINDINGS: Displaced comminuted fractures are present in the distal fibula and at the medial malleolus. The talus is displaced laterally. The fractures are displaced 1 shaft width. A posterior tibial fractures present as well. Moderate osteopenia is noted. Vascular calcifications are present. IMPRESSION: 1. Comminuted trimalleolar fracture with lateral displacement of the talus. 2. Distal fibular fracture is displaced 1 shaft width. 3. Moderate osteopenia. 4. Microvascular calcifications compatible with known diagnosis of diabetes. Electronically Signed   By: Marin Roberts M.D.   On: 04/12/2017 11:01   Dg Abd 1 View  Result Date: 03/23/2017 CLINICAL DATA:  Orogastric tube placement EXAM: ABDOMEN - 1 VIEW COMPARISON:  None. FINDINGS: Esophageal tube tip and side port overlie the distal esophagus. Mild gastric distention. Dilated loops of small bowel in the central abdomen. IMPRESSION: Esophageal tube tip projects over the distal esophagus, suggest further advancement by approximately 10 cm. Dilated loops of bowel in the central abdomen. Electronically Signed   By: Jasmine Pang M.D.   On: 03-Apr-2017 23:39   Portable Chest Xray  Result Date: 03/31/2017 CLINICAL DATA:  Intubation and central line placement. EXAM: PORTABLE CHEST 1 VIEW COMPARISON:  04/11/2017 FINDINGS: The heart size is enlarged. The mediastinal contour is normal. Endotracheal tube is identified distal tip 1 cm from carina, retraction by 3 cm is recommended. A left central venous line is identified with distal tip in the superior vena cava. There is no pleural line to suggest pneumothorax. There is pulmonary edema. There is no focal pneumonia or pleural effusion. IMPRESSION: Endotracheal tube distal tip 1 cm from  carina, retraction by 3 cm is recommended. Left central venous line with distal tip in the superior  vena cava. Pulmonary edema. These results will be called to the ordering clinician or representative by the Radiologist Assistant, and communication documented in the PACS or zVision Dashboard. Electronically Signed   By: Sherian Rein M.D.   On: 04/04/2017 20:29   Dg Chest Portable 1 View  Result Date: 04/06/2017 CLINICAL DATA:  Preop. EXAM: PORTABLE CHEST 1 VIEW COMPARISON:  None. FINDINGS: Midline trachea. Mild cardiomegaly. Atherosclerosis in the transverse aorta. No pleural effusion or pneumothorax. A right axillary vascular stent. No congestive failure. Clear lungs. IMPRESSION: Cardiomegaly without congestive failure. Aortic Atherosclerosis (ICD10-I70.0).  This is age advanced. Electronically Signed   By: Jeronimo Greaves M.D.   On: 04/04/2017 11:48   Dg Ankle Right Port  Result Date: 03/30/2017 CLINICAL DATA:  Post reduction in plaster images EXAM: PORTABLE RIGHT ANKLE - 2 VIEW COMPARISON:  Pre reduction ankle series of today's date FINDINGS: The patient has undergone close reduction of the comminuted trimalleolar fracture. There remains some disruption of the ankle joint mortise with lateral migration of the talar dome with respect to the tibial plafond. The distal fibular fracture has been partially reduced as has the medial malleolar fracture. Involving the distal IMPRESSION: Partial reduction of the trimalleolar fracture of the right ankle with persistent disruption of the ankle joint mortise. Electronically Signed   By: David  [place] M.D.   On: 04/05/2017 13:24    Microbiology Recent Results (from the past 240 hour(s))  Surgical PCR screen     Status: None   Collection Time: 03/20/2017  7:21 PM  Result Value Ref Range Status   MRSA, PCR NEGATIVE NEGATIVE Final   Staphylococcus aureus NEGATIVE NEGATIVE Final    Comment: (NOTE) The Xpert SA Assay (FDA approved for NASAL specimens in patients 11 years of age and older), is one component of a comprehensive surveillance program. It is not  intended to diagnose infection nor to guide or monitor treatment. Performed at [facility], [address] [place], [place] [address]     Lab Basic Metabolic Panel: Recent Labs  Lab 04/02/2017 1224 04/16/2017 1815 03/30/2017 0458 03/17/2017 1724 04/04/2017 2201  NA 138 140  --  169* 158*  K 5.9* 5.3* 4.7 4.4 4.1  CL 96* 97*  --  87* 92*  CO2 26 26  --  49* 28  GLUCOSE 114* 198*  --  193* 223*  BUN 53* 53*  --  34* 40*  CREATININE 8.11* 8.62*  --  5.19* 5.87*  CALCIUM 9.4 9.2  --  6.7* 8.0*  MG  --   --   --  2.9* 2.5*  PHOS  --  8.4*  --  7.2* 7.1*   Liver Function Tests: Recent Labs  Lab 04/04/2017 1224 03/20/2017 1724  AST 18 317*  ALT 15 291*  ALKPHOS 148* 80  BILITOT 0.7 0.9  PROT 7.9 4.2*  ALBUMIN 3.7 2.0*   No results for input(s): LIPASE, AMYLASE in the last 168 hours. No results for input(s): AMMONIA in the last 168 hours. CBC: Recent Labs  Lab 03/26/2017 1105 03/29/2017 0457 04/07/2017 1724  WBC 7.0 9.3 10.4  NEUTROABS 5.1  --   --   HGB 12.9 11.4* 8.2*  HCT 40.7 35.4 25.6*  MCV 96.5 97.1 97.1  PLT 205 204 142*   Cardiac Enzymes: Recent Labs  Lab 04/02/2017 1224 03/22/2017 1815 04/05/2017 0034 04/10/2017 1724 04/02/2017 2201  TROPONINI 0.06*  0.06* 0.05* 0.20* 5.08*   Sepsis Labs: Recent Labs  Lab 04/07/2017 1105 03/22/2017 0457 03/27/2017 1724  WBC 7.0 9.3 10.4  LATICACIDVEN  --   --  13.9*    Procedures/Operations  Open Reduction Internal Fixation Trimalleolar of Right Ankle ETT  Left Internal Jugular Central Line Placement  Sonda Rumbleana Jozalyn Baglio, Arkansas[place]NP  Pulmonary/Critical Care Pager [phone] (please enter 7 digits) PCCM Consult Pager [phone] (please enter 7 digits)

## 2017-04-17 NOTE — Progress Notes (Signed)
Pt pronounced at 00:07 by myself and Misty, rn.  Family at bedside.  Supervisor made aware.

## 2017-04-17 NOTE — Procedures (Signed)
Central Venous Catheter Insertion Procedure Note Sarah MuskratLaura Schroeder [id number] 1970-12-15  Procedure: Insertion of Central Venous Catheter Indications: Assessment of intravascular volume, Drug and/or fluid administration and Frequent blood sampling  Procedure Details Consent: Unable to obtain consent because of emergent medical necessity. Time Out: Verified patient identification, verified procedure, site/side was marked, verified correct patient position, special equipment/implants available, medications/allergies/relevent history reviewed, required imaging and test results available.  Performed  Maximum sterile technique was used including antiseptics, cap, gloves, gown, hand hygiene, mask and sheet. Skin prep: Chlorhexidine; local anesthetic administered A antimicrobial bonded/coated triple lumen catheter was placed in the left internal jugular vein using the Seldinger technique.  Evaluation Blood flow good Complications: No apparent complications Patient did tolerate procedure well. Chest X-ray ordered to verify placement.  CXR: normal. Line placed with CPR in progress Procedure performed under direct supervision of Dr.Kasa. Ultrasound utilized for realtime vessel cannulation  Sarah Schroeder ANP-BC Pulmonary and Critical Care Medicine [facility] Pager [phone] or [phone]  NB: This document was prepared using Dragon voice recognition software and may include unintentional dictation errors.    03/22/2017, 12:53 PM

## 2017-04-17 NOTE — Progress Notes (Signed)
   02-11-2018 2330  Clinical Encounter Type  Visited With Family  Visit Type Follow-up;Spiritual support;Death  Referral From Nurse  Consult/Referral To Chaplain  Spiritual Encounters  Spiritual Needs Prayer;Emotional;Grief support  Stress Factors  Family Stress Factors Loss   Chaplain responded to page from nurse.  A large family was present and supported pt as she passed.  Chaplain offered a prayer and spiritual support to family during grieving process.  Rev. Keldon Lassen Zenaida NieceVan Ameris[employer]erg[employer] Corporation[employer]alduinen Chaplain

## 2017-04-17 DEATH — deceased

## 2019-04-05 IMAGING — DX DG ABDOMEN 1V
1 series · 1 of 1 positions shown · non-contrast
Comparison: None.

CLINICAL DATA: Orogastric tube placement

EXAM:
ABDOMEN - 1 VIEW

[abdomen kub]
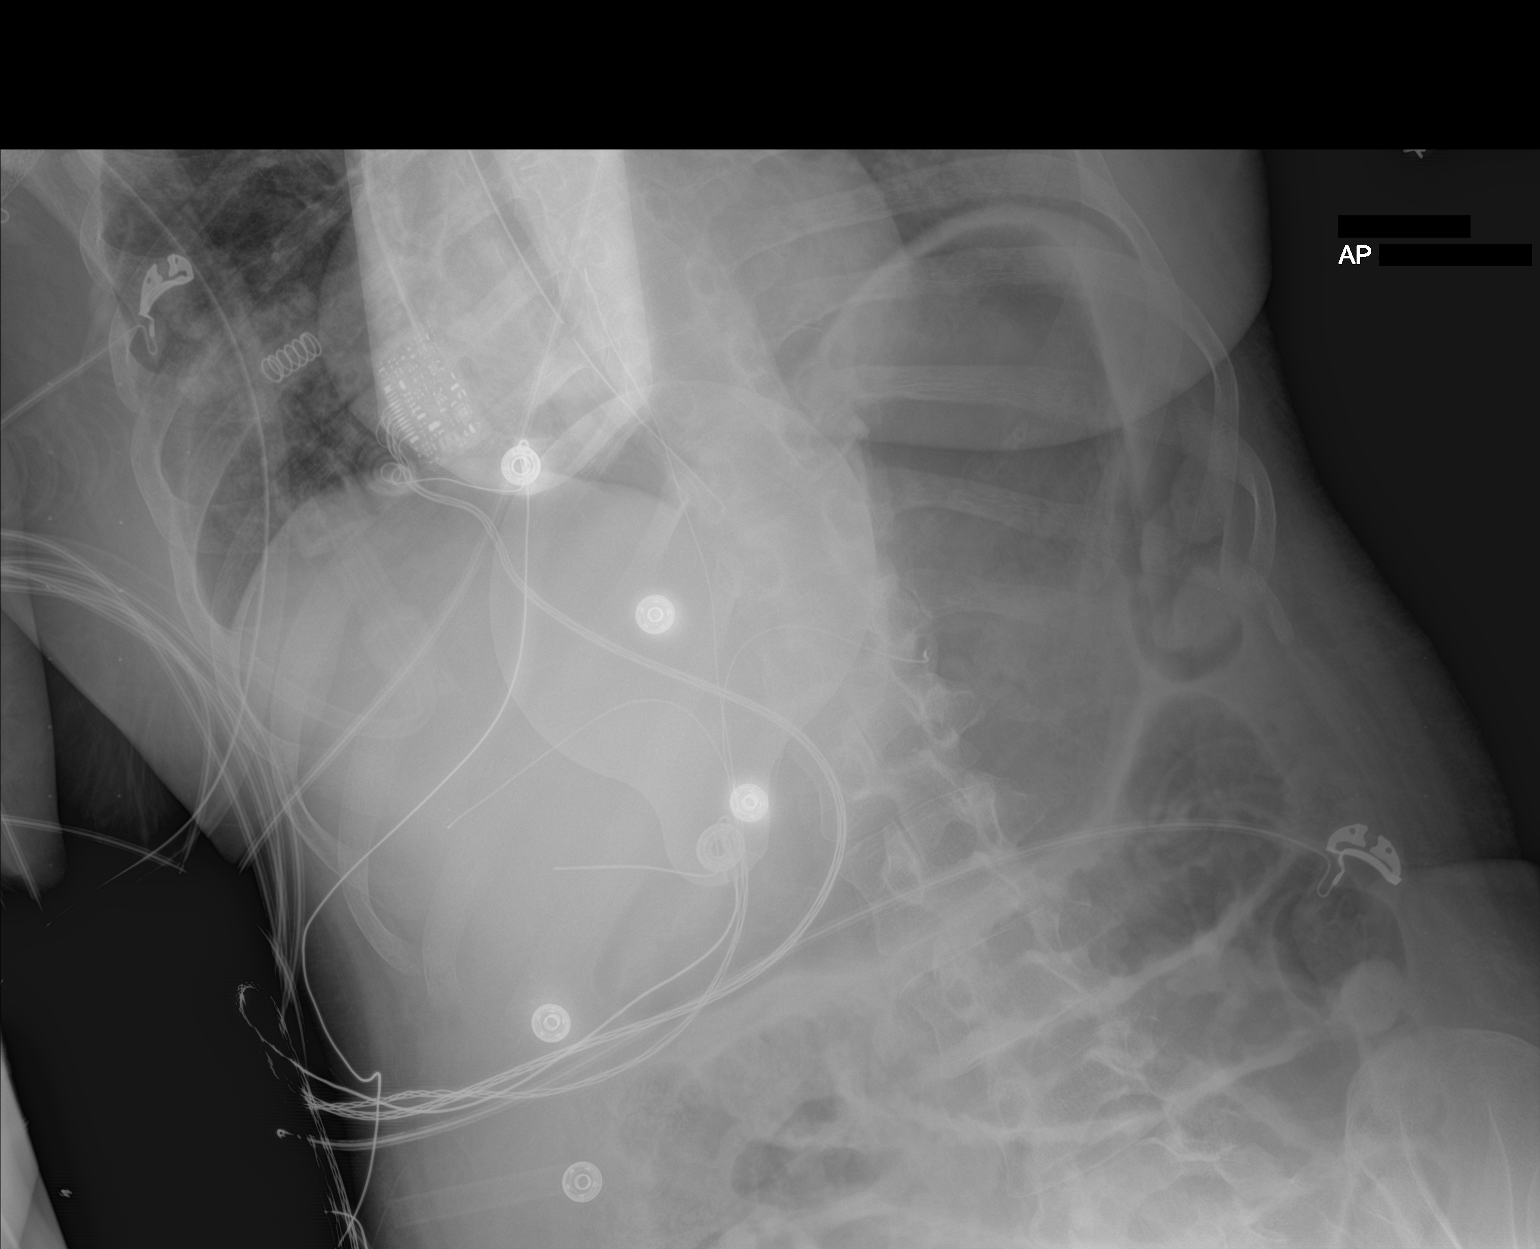

[1 of 1 positions shown; findings below may reference images not displayed]

FINDINGS: Esophageal tube tip and side port overlie the distal esophagus. Mild
gastric distention. Dilated loops of small bowel in the central
abdomen.
IMPRESSION: Esophageal tube tip projects over the distal esophagus, suggest
further advancement by approximately 10 cm. Dilated loops of bowel
in the central abdomen.

## 2019-04-05 IMAGING — DX DG CHEST 1V PORT
1 series · 1 of 1 positions shown · non-contrast
Comparison: April 01, 2017

CLINICAL DATA: Intubation and central line placement.

EXAM:
PORTABLE CHEST 1 VIEW

[chest ap]
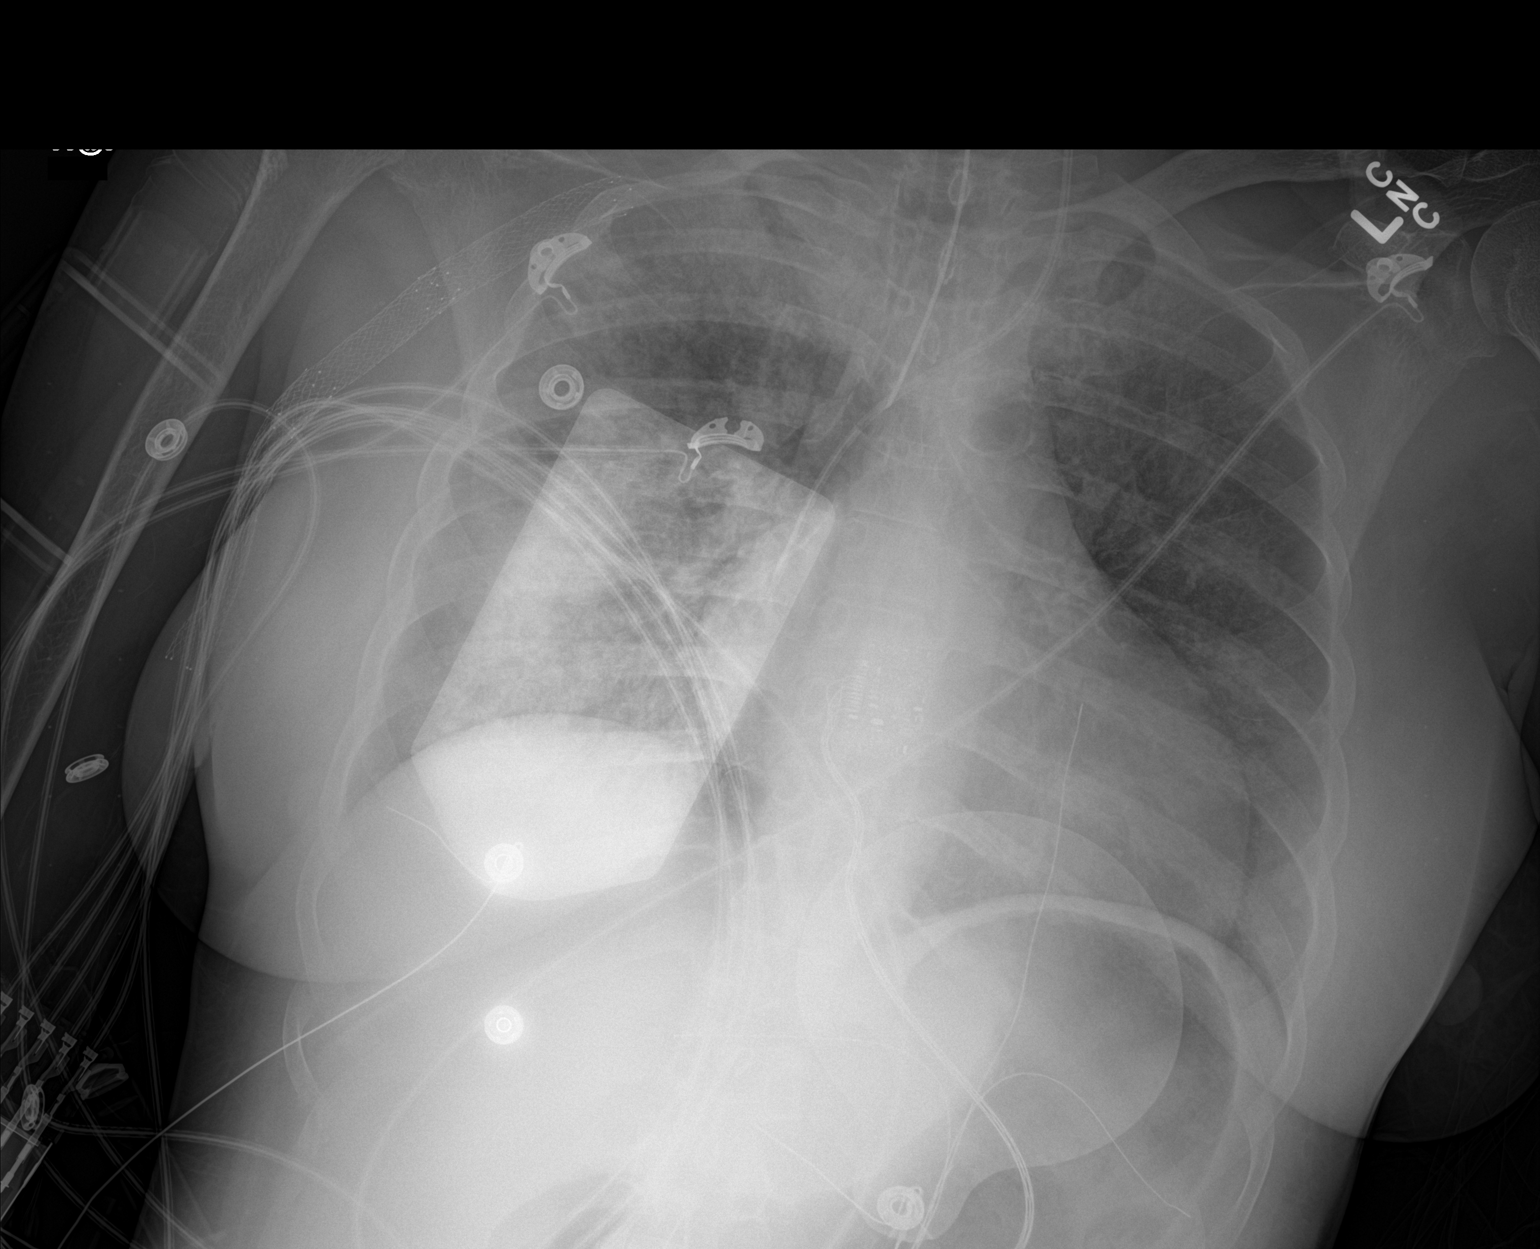

[1 of 1 positions shown; findings below may reference images not displayed]

FINDINGS: The heart size is enlarged. The mediastinal contour is normal.
Endotracheal tube is identified distal tip 1 cm from carina,
retraction by 3 cm is recommended. A left central venous line is
identified with distal tip in the superior vena cava. There is no
pleural line to suggest pneumothorax. There is pulmonary edema.
There is no focal pneumonia or pleural effusion.
IMPRESSION: Endotracheal tube distal tip 1 cm from carina, retraction by 3 cm is
recommended. Left central venous line with distal tip in the
superior vena cava.

Pulmonary edema.

These results will be called to the ordering clinician or
representative by the Radiologist Assistant, and communication
documented in the PACS or zVision Dashboard.
